# Patient Record
Sex: Female | Born: 1950 | Race: Black or African American | Hispanic: No | Marital: Married | Smoking: Former smoker
Health system: Southern US, Community
[De-identification: ages and names within clinical notes are randomized; demographics above are authoritative.]

## PROBLEM LIST (undated history)

## (undated) DIAGNOSIS — I10 Essential (primary) hypertension: Secondary | ICD-10-CM

## (undated) DIAGNOSIS — E669 Obesity, unspecified: Secondary | ICD-10-CM

## (undated) DIAGNOSIS — H409 Unspecified glaucoma: Secondary | ICD-10-CM

## (undated) DIAGNOSIS — E785 Hyperlipidemia, unspecified: Secondary | ICD-10-CM

## (undated) HISTORY — DX: Hyperlipidemia, unspecified: E78.5

## (undated) HISTORY — PX: TOTAL KNEE ARTHROPLASTY: SHX125

## (undated) HISTORY — DX: Essential (primary) hypertension: I10

## (undated) HISTORY — DX: Obesity, unspecified: E66.9

## (undated) HISTORY — DX: Unspecified glaucoma: H40.9

---

## 1999-03-14 HISTORY — PX: THYROIDECTOMY, PARTIAL: SHX18

## 1999-03-14 HISTORY — PX: TRIGGER FINGER RELEASE: SHX641

## 1999-08-08 ENCOUNTER — Ambulatory Visit (HOSPITAL_COMMUNITY): Admission: RE | Admit: 1999-08-08 | Discharge: 1999-08-08 | Payer: Self-pay | Admitting: *Deleted

## 1999-10-19 ENCOUNTER — Encounter: Payer: Self-pay | Admitting: Surgery

## 1999-10-21 ENCOUNTER — Ambulatory Visit (HOSPITAL_COMMUNITY): Admission: RE | Admit: 1999-10-21 | Discharge: 1999-10-22 | Payer: Self-pay | Admitting: Surgery

## 1999-10-31 ENCOUNTER — Encounter: Payer: Self-pay | Admitting: Obstetrics and Gynecology

## 1999-10-31 ENCOUNTER — Encounter: Admission: RE | Admit: 1999-10-31 | Discharge: 1999-10-31 | Payer: Self-pay | Admitting: Obstetrics and Gynecology

## 2000-10-08 ENCOUNTER — Encounter (INDEPENDENT_AMBULATORY_CARE_PROVIDER_SITE_OTHER): Payer: Self-pay | Admitting: Specialist

## 2000-10-08 ENCOUNTER — Other Ambulatory Visit: Admission: RE | Admit: 2000-10-08 | Discharge: 2000-10-08 | Payer: Self-pay | Admitting: Gynecology

## 2000-11-20 ENCOUNTER — Other Ambulatory Visit: Admission: RE | Admit: 2000-11-20 | Discharge: 2000-11-20 | Payer: Self-pay | Admitting: Gynecology

## 2000-12-25 ENCOUNTER — Inpatient Hospital Stay (HOSPITAL_COMMUNITY): Admission: RE | Admit: 2000-12-25 | Discharge: 2000-12-28 | Payer: Self-pay | Admitting: Gynecology

## 2000-12-25 ENCOUNTER — Encounter (INDEPENDENT_AMBULATORY_CARE_PROVIDER_SITE_OTHER): Payer: Self-pay | Admitting: Specialist

## 2001-01-17 ENCOUNTER — Encounter: Payer: Self-pay | Admitting: Family Medicine

## 2001-01-17 ENCOUNTER — Encounter: Admission: RE | Admit: 2001-01-17 | Discharge: 2001-01-17 | Payer: Self-pay | Admitting: Family Medicine

## 2001-08-27 ENCOUNTER — Emergency Department (HOSPITAL_COMMUNITY): Admission: EM | Admit: 2001-08-27 | Discharge: 2001-08-27 | Payer: Self-pay | Admitting: Emergency Medicine

## 2002-03-13 HISTORY — PX: ROTATOR CUFF REPAIR: SHX139

## 2002-03-13 HISTORY — PX: ABDOMINAL HYSTERECTOMY: SHX81

## 2002-11-21 ENCOUNTER — Encounter: Admission: RE | Admit: 2002-11-21 | Discharge: 2002-11-21 | Payer: Self-pay | Admitting: Obstetrics and Gynecology

## 2002-11-21 ENCOUNTER — Encounter: Payer: Self-pay | Admitting: Obstetrics and Gynecology

## 2003-01-29 ENCOUNTER — Other Ambulatory Visit: Admission: RE | Admit: 2003-01-29 | Discharge: 2003-01-29 | Payer: Self-pay | Admitting: Gynecology

## 2003-12-22 ENCOUNTER — Encounter: Admission: RE | Admit: 2003-12-22 | Discharge: 2003-12-22 | Payer: Self-pay | Admitting: Family Medicine

## 2004-12-26 ENCOUNTER — Encounter: Admission: RE | Admit: 2004-12-26 | Discharge: 2004-12-26 | Payer: Self-pay | Admitting: Family Medicine

## 2004-12-27 ENCOUNTER — Other Ambulatory Visit: Admission: RE | Admit: 2004-12-27 | Discharge: 2004-12-27 | Payer: Self-pay | Admitting: Family Medicine

## 2005-01-26 ENCOUNTER — Ambulatory Visit (HOSPITAL_COMMUNITY): Admission: RE | Admit: 2005-01-26 | Discharge: 2005-01-26 | Payer: Self-pay | Admitting: Gastroenterology

## 2005-01-26 ENCOUNTER — Encounter (INDEPENDENT_AMBULATORY_CARE_PROVIDER_SITE_OTHER): Payer: Self-pay | Admitting: *Deleted

## 2005-06-14 ENCOUNTER — Encounter (INDEPENDENT_AMBULATORY_CARE_PROVIDER_SITE_OTHER): Payer: Self-pay | Admitting: Specialist

## 2005-06-14 ENCOUNTER — Observation Stay (HOSPITAL_COMMUNITY): Admission: EM | Admit: 2005-06-14 | Discharge: 2005-06-15 | Payer: Self-pay | Admitting: Emergency Medicine

## 2006-01-25 ENCOUNTER — Encounter: Admission: RE | Admit: 2006-01-25 | Discharge: 2006-01-25 | Payer: Self-pay | Admitting: Family Medicine

## 2006-02-07 ENCOUNTER — Encounter: Admission: RE | Admit: 2006-02-07 | Discharge: 2006-02-07 | Payer: Self-pay | Admitting: Obstetrics and Gynecology

## 2006-08-10 ENCOUNTER — Encounter: Admission: RE | Admit: 2006-08-10 | Discharge: 2006-08-10 | Payer: Self-pay | Admitting: Family Medicine

## 2006-08-21 ENCOUNTER — Ambulatory Visit (HOSPITAL_BASED_OUTPATIENT_CLINIC_OR_DEPARTMENT_OTHER): Admission: RE | Admit: 2006-08-21 | Discharge: 2006-08-21 | Payer: Self-pay | Admitting: Orthopaedic Surgery

## 2007-01-31 ENCOUNTER — Encounter: Admission: RE | Admit: 2007-01-31 | Discharge: 2007-01-31 | Payer: Self-pay | Admitting: Family Medicine

## 2007-02-11 ENCOUNTER — Inpatient Hospital Stay (HOSPITAL_BASED_OUTPATIENT_CLINIC_OR_DEPARTMENT_OTHER): Admission: RE | Admit: 2007-02-11 | Discharge: 2007-02-11 | Payer: Self-pay | Admitting: Cardiology

## 2007-03-14 HISTORY — PX: APPENDECTOMY: SHX54

## 2007-09-24 ENCOUNTER — Inpatient Hospital Stay (HOSPITAL_COMMUNITY): Admission: RE | Admit: 2007-09-24 | Discharge: 2007-09-26 | Payer: Self-pay | Admitting: Orthopaedic Surgery

## 2008-02-04 ENCOUNTER — Encounter: Admission: RE | Admit: 2008-02-04 | Discharge: 2008-02-04 | Payer: Self-pay | Admitting: Family Medicine

## 2008-09-10 ENCOUNTER — Inpatient Hospital Stay (HOSPITAL_COMMUNITY): Admission: RE | Admit: 2008-09-10 | Discharge: 2008-09-13 | Payer: Self-pay | Admitting: Orthopaedic Surgery

## 2009-02-05 ENCOUNTER — Encounter: Admission: RE | Admit: 2009-02-05 | Discharge: 2009-02-05 | Payer: Self-pay | Admitting: Family Medicine

## 2010-03-16 ENCOUNTER — Encounter
Admission: RE | Admit: 2010-03-16 | Discharge: 2010-03-16 | Payer: Self-pay | Source: Home / Self Care | Attending: Family Medicine | Admitting: Family Medicine

## 2010-04-07 ENCOUNTER — Other Ambulatory Visit: Payer: Self-pay | Admitting: Gastroenterology

## 2010-05-10 ENCOUNTER — Other Ambulatory Visit (HOSPITAL_COMMUNITY)
Admission: RE | Admit: 2010-05-10 | Discharge: 2010-05-10 | Disposition: A | Discharge status: Home / Self Care | Payer: BC Managed Care – HMO | Source: Ambulatory Visit | Attending: Family Medicine | Admitting: Family Medicine

## 2010-05-10 ENCOUNTER — Other Ambulatory Visit: Payer: Self-pay | Admitting: Family Medicine

## 2010-05-10 DIAGNOSIS — Z124 Encounter for screening for malignant neoplasm of cervix: Secondary | ICD-10-CM | POA: Insufficient documentation

## 2010-06-19 LAB — PROTIME-INR
INR: 1 (ref 0.00–1.49)
INR: 1.8 — ABNORMAL HIGH (ref 0.00–1.49)
Prothrombin Time: 13.7 seconds (ref 11.6–15.2)
Prothrombin Time: 16.5 seconds — ABNORMAL HIGH (ref 11.6–15.2)
Prothrombin Time: 22.1 seconds — ABNORMAL HIGH (ref 11.6–15.2)

## 2010-06-19 LAB — BASIC METABOLIC PANEL
BUN: 17 mg/dL (ref 6–23)
BUN: 27 mg/dL — ABNORMAL HIGH (ref 6–23)
CO2: 26 mEq/L (ref 19–32)
CO2: 27 mEq/L (ref 19–32)
Calcium: 9.5 mg/dL (ref 8.4–10.5)
Calcium: 9.6 mg/dL (ref 8.4–10.5)
Chloride: 101 mEq/L (ref 96–112)
Creatinine, Ser: 1.07 mg/dL (ref 0.4–1.2)
Creatinine, Ser: 1.31 mg/dL — ABNORMAL HIGH (ref 0.4–1.2)
Creatinine, Ser: 1.48 mg/dL — ABNORMAL HIGH (ref 0.4–1.2)
GFR calc Af Amer: >60 mL/min (ref >60)
GFR calc non Af Amer: 36 mL/min — ABNORMAL LOW (ref >60)
GFR calc non Af Amer: 53 mL/min — ABNORMAL LOW (ref >60)
Glucose, Bld: 149 mg/dL — ABNORMAL HIGH (ref 70–99)
Glucose, Bld: 154 mg/dL — ABNORMAL HIGH (ref 70–99)
Glucose, Bld: 155 mg/dL — ABNORMAL HIGH (ref 70–99)
Potassium: 4 mEq/L (ref 3.5–5.1)
Sodium: 139 mEq/L (ref 135–145)

## 2010-06-19 LAB — CBC
HCT: 31.6 % — ABNORMAL LOW (ref 36.0–46.0)
Hemoglobin: 10.9 g/dL — ABNORMAL LOW (ref 12.0–15.0)
MCHC: 33.8 g/dL (ref 30.0–36.0)
MCHC: 34.3 g/dL (ref 30.0–36.0)
MCV: 90.3 fL (ref 78.0–100.0)
MCV: 90.8 fL (ref 78.0–100.0)
Platelets: 178 10*3/uL (ref 150–400)
Platelets: 191 10*3/uL (ref 150–400)
RDW: 12.6 % (ref 11.5–15.5)
RDW: 12.7 % (ref 11.5–15.5)
RDW: 13.1 % (ref 11.5–15.5)
WBC: 11.2 10*3/uL — ABNORMAL HIGH (ref 4.0–10.5)

## 2010-06-20 LAB — CBC
MCV: 90.5 fL (ref 78.0–100.0)
Platelets: 243 10*3/uL (ref 150–400)
RBC: 4.3 MIL/uL (ref 3.87–5.11)
WBC: 7.5 10*3/uL (ref 4.0–10.5)

## 2010-06-20 LAB — BASIC METABOLIC PANEL
BUN: 17 mg/dL (ref 6–23)
Calcium: 10.6 mg/dL — ABNORMAL HIGH (ref 8.4–10.5)
Creatinine, Ser: 0.9 mg/dL (ref 0.4–1.2)
GFR calc Af Amer: >60 mL/min (ref >60)
GFR calc non Af Amer: >60 mL/min (ref >60)

## 2010-07-26 NOTE — Op Note (Signed)
[name], [name] NO.:  [id number]   MEDICAL RECORD NO.:  [id number]          PATIENT TYPE:  INP   LOCATION:  5041                         FACILITY:  [facility]   PHYSICIAN:  Lubertha Basque. Dalldorf, M.D.DATE OF BIRTH:  1951-01-22   DATE OF PROCEDURE:  09/24/2007  DATE OF DISCHARGE:                               OPERATIVE REPORT   PREOPERATIVE DIAGNOSIS:  Left knee degenerative joint disease.   POSTOPERATIVE DIAGNOSES:  Left knee degenerative joint disease.   PROCEDURE:  Left total knee replacement.   ANESTHESIA:  General and block.   ATTENDING SURGEON:  Lubertha Basque. Jerl Santos, MD.   ASSISTANT:  Lindwood Qua, PA   INDICATIONS FOR PROCEDURE:  The patient is a 60 year old woman with a  long history of knee difficulty.  She has tried various oral anti-  inflammatories and injectables but none seem to work at this point.  She  has had a knee arthroscopy in years past.  She has advanced degenerative  change and pain which limits her ability to walk at rest.  She is  offered a knee replacement on the most symptomatic side which is the  left.  Informed operative consent was obtained after discussion of  possible complications of reaction to anesthesia, infection, DVT, PE,  and death.  The patient also understood about the importance of the  postoperative rehabilitation protocol to optimize result.   SUMMARY OF FINDINGS AND PROCEDURE:  Under general anesthesia and a  block, a left knee replacement was performed through a standard anterior  approach.  She had advanced degenerative change and good bone quality.  We addressed her problem with a cemented DePuy system using a standard  femur, 2.5 MBT tray, 35-mm all-polyethylene patella, and 10-mm deep dish  spacer.  Bryna Colander assisted throughout and was invaluable to the  completion of the case in that he helped position and retract while I  performed the procedure.  He also closed simultaneously to help minimize  the OR  time.   DESCRIPTION OF PROCEDURE:  The patient was brought to the operating  suite where general anesthetic was applied without difficulty.  She was  also given a block in the preanesthesia area.  She was positioned supine  and prepped and draped in normal sterile fashion.  After the  administration of IV Kefzol, the left leg was elevated, exsanguinated,  and tourniquet inflated about the thigh.  A longitudinal anterior  incision was made with dissection down the extensor mechanism.  All  appropriate antiinfective measures were used including the preoperative  IV antibiotic, Betadine impregnated drape, and closed-hooded exhaust  systems for each member of the surgical team.  A medial parapatellar  incision was made.  The kneecap was flipped and the knee flexed.  We did  have to create a adipose pouch to accept the kneecap, as she had about 2-  inch of adipose tissue between the skin and extensor mechanism.  This  extra tissue did make the case fairly difficult and added some  significant time.  The knee was exposed appropriately.  An  intramedullary guide was placed on the tibia  and utilized to create a  cut on the proximal surface of this bone.  An intramedullary rod was  then placed into the femur to make anterior and posterior cuts creating  a flexion gap of 10 mm.  A second intramedullary guide was placed in the  femur to make a distal cut creating an equal extension gap of 10 mm.  The femur sized to a standard and the tibia to a 2.5 with the  appropriate guides placed and utilized.  We elected to place the MBT  revision component on the tibia to address her stature.  The appropriate  guide was placed and utilized for the short stem on this component.  Trial reduction was done with these components along with the 10-mm  spacer.  She easily came to full extension and flexed well.  The patella  was cut down to a thickness by 9-mm to a size diameter of 15.  This  sized to a 35 with  appropriate guide placed utilized.  Trial components  removed followed by pulsatile lavage irrigation of all three cut bony  surfaces.  Cement was mixed including Zinacef antibiotic and was  pressurized onto the bones.  The aforementioned DePuy components were  placed.  Excess cement was trimmed and the pressure was held on the  components until the cement had hardened.  The tourniquet was deflated  and a small amount of bleeding was easily controlled with Bovie cautery.  The knee was thoroughly irrigated followed by placement of drain exiting  superolaterally.  The extensor mechanism was reapproximated with #1  Vicryl interrupted fashion followed by subcutaneous reapproximation with  0 and 2-0 undyed Vicryl.  Staples were used to reapproximate the skin  followed by Adaptic and a dry gauze dressing and loose Ace wrap.  Estimated blood loss and fluids can be obtained from anesthesia records  as can accurate tourniquet time.   DISPOSITION:  The patient was extubated in the operating room and taken  to recovery room in stable addition.  She will be admitted to the  Orthopedic Surgery Service for appropriate postop care to include  perioperative antibiotics and Coumadin plus Lovenox for DVT prophylaxis.      Lubertha Basque Jerl Santos, M.D.  Electronically Signed     PGD/MEDQ  D:  09/24/2007  T:  09/24/2007  Job:  [id number]

## 2010-07-26 NOTE — Discharge Summary (Signed)
[name], [name] NO.:  [id number]   MEDICAL RECORD NO.:  [id number]          PATIENT TYPE:  INP   LOCATION:  5039                         FACILITY:  [facility]   PHYSICIAN:  Lubertha Basque. Dalldorf, M.D.DATE OF BIRTH:  Jul 12, 1950   DATE OF ADMISSION:  09/10/2008  DATE OF DISCHARGE:  09/13/2008                               DISCHARGE SUMMARY   ADMITTING DIAGNOSES:  1. Right knee end-stage degenerative joint disease.  2. Left knee total knee replacement.  3. Hypertension.  4. Obesity.   DISCHARGE DIAGNOSES:  1. Right knee end-stage degenerative joint disease.  2. Left knee total knee replacement.  3. Hypertension.  4. Obesity.   OPERATION:  Right total knee replacement.   BRIEF HISTORY:  Ms. Umana is a 60 year old black female patient, well  known to our practice.  She has had a left knee replacement done, while  the right knee continues to bother her, pain with every step, trouble  sleeping at night time.  X-rays reveal end-stage DJD and we have  discussed treatment options with her that being total knee replacement.   PERTINENT LABORATORY DATA AND X-RAY FINDINGS:  Hemoglobin 10.8,  hematocrit 31.6, WBCs 11.2.  Sodium 139, potassium 4.0, BUN 27,  creatinine 1.48, glucose 149.  Last INR at 1.7.   COURSE IN THE HOSPITAL:  She was admitted postoperatively, placed on a  variety of p.o. and IV pain medications.  A Dilaudid, morphine pump was  used.  IV Ancef a gram q.6 x3 doses.  Lovenox and Coumadin DVT  prophylaxis protocol per Pharmacy, and then antiemetics, iron building  agents, antispasmodics were also incorporated with laxatives and stool  softeners.  She had knee-high TEDs on, incentive spirometer, physical  therapy for weightbearing as tolerated out of bed, and then a CPM  machine as well.  The first day postop, her blood pressure was 130/70,  temperature 98, heart rate 76.  Her Foley catheter was in but later  discontinued.  She was eating and voiding  well after the catheter  removal, and a PCA pump was also discontinued.  Her dressing was dry.  Wound was noted to be benign.  On day #2 postop, blood pressure 106/67,  heart rate 102, temperature 98.  Dressing was changed.  Drain was  removed.  Wound was noted to be benign without sign of infection.  She  progressed well with physical therapy and was discharged home.   CONDITION ON DISCHARGE:  Improved.   FOLLOWUP:  She will remain on a low-sodium, heart-healthy diet.  May  change her dressing daily.  Weightbearing as tolerated.  Home physical  therapy and blood draws per [facility].  Any sign of infection and also  to make an appointment in 2 weeks, to call our office at [phone].  She  is given a prescription for Coumadin and she will be on for 2 weeks,  dose regulated by Pharmacy; Percocet 1 or 2 every 4-6 hours for pain.  I  then kept on her home medicines which are:  1. Claritin 10 mg 1 a day.  2. Ferrous sulfate 325 one  a day.  3. Hydrochlorothiazide 12.5 mg 1 a day.  4. Lotensin 20 mg one a day.  5. Multivitamin with minerals 1 a day.  6. Norvasc 10 mg 1 a day.  7. Zocor 80 mg 1 a day.  8. Robaxin 500 one q.8 p.r.n. spasm.      Lindwood Qua, P.A.      Lubertha Basque Jerl Santos, M.D.  Electronically Signed    MC/MEDQ  D:  09/13/2008  T:  09/14/2008  Job:  [id number]

## 2010-07-26 NOTE — Op Note (Signed)
[name], DOROUGH NO.:  [id number]   MEDICAL RECORD NO.:  [id number]          PATIENT TYPE:  INP   LOCATION:  5039                         FACILITY:  [facility]   PHYSICIAN:  Lubertha Basque. Dalldorf, M.D.DATE OF BIRTH:  15-Jan-1951   DATE OF PROCEDURE:  09/10/2008  DATE OF DISCHARGE:                               OPERATIVE REPORT   PREOPERATIVE DIAGNOSIS:  Right knee degenerative arthritis.   POSTOPERATIVE DIAGNOSIS:  Right knee degenerative arthritis.   PROCEDURE:  Right total knee replacement.   ANESTHESIA:  General and block.   ATTENDING SURGEON:  Lubertha Basque. Jerl Santos, MD   ASSISTANT:  Lindwood Qua, PA   INDICATIONS FOR PROCEDURE:  The patient is a 60 year old woman with long  history of bilateral knee pain.  She persists with difficulty despite  oral anti-inflammatories and injectables even an arthroscopy on this  side.  She is status post a successful knee replacement on the opposite  side about a year back and wishes to have the same procedure done on the  right.  Informed operative consent was obtained after discussion of  possible complications including reaction to anesthesia, infection, DVT,  PE, and death.   SUMMARY OF FINDINGS AND PROCEDURE:  Under general anesthesia and a block  through a standard anterior longitudinal incision, a right total knee  replacement was performed.  She had advanced degenerative change,  especially medial, but excellent bone quality.  We addressed her problem  with a cemented DePuy LPS system using the standard femur, 10 deep dish  spacer, 2.5 MBT revision tibial tray, and 35-mm all-polyethylene  patella, components which I believe were identical to those used in the  opposite side.  I did use antibiotic Zinacef in the cement.  Bryna Colander assisted throughout and was invaluable to the completion of the  case, in that, he helped to retract while I performed the procedure.  He  also closed simultaneously to help minimize  OR time.  This case was  fairly difficult due to the size of the patient's leg, which added  significantly to difficulty of exposure and operative and tourniquet  times.   DESCRIPTION OF PROCEDURE:  The patient was taken to the operating suite  where general anesthetic was applied without difficulty.  She was  positioned supine and prepped and draped in normal sterile fashion.  She  was also given a block in the preanesthesia area.  After the  administration of preop IV antibiotic, an anterior longitudinal incision  was made.  Dissection was carried down to the extensor mechanism.  All  appropriate anti-infected measures were used including closed-hooded  exhaust systems for each member of the surgical team, Betadine  impregnated drape, and preoperative IV Kefzol.  We dissected down  through an abundance of adipose tissue and found the retinaculum about  the knee.  A medial parapatellar incision was made.  We created a pocket  in the adipose tissue to accept the patella and this could not flip  completely due to her size.  We were able flip this and take it lateral  into an adipose pad and bent  her knee up.  Exposure again was quite  difficult due to the size of the leg.  Eventually, the tibia was exposed  fully.  Some residual meniscal tissues were removed along with the ACL  and PCL.  An intramedullary guide was placed in the tibia to make a cut  roughly flat across the tibia.  We then placed a second intramedullary  guide in the femur to make anterior and posterior cuts creating a  flexion gap of 10 mm.  A second intramedullary guide was placed in the  femur to make a distal cut creating an equal extension gap of 10 mm  balancing the knee.  The femur sized to a standard component and the  tibia to 2.5 with appropriate guides placed and utilized.  We did elect  to ream for the short MBT stem on the tibia to address her stature.  The  patella was cut down in thickness by 10 mm to 15 and  sized to a 35 with  the appropriate guide placed and utilized.  A trial reduction was done  with all these components in the 10 spacer.  She easily came to full  extension and flexed well and the patella tracked well.  Trial  components were removed followed by pulsatile lavage irrigation of three  cut bony surfaces.  The cement was mixed including Zinacef and this was  pressurized onto the bones followed by placement of the aforementioned  DePuy LPS components.  Excess cement was trimmed and pressure was held  on the components until cement had hardened.  The tourniquet was  deflated and a small amount of bleeding was easily controlled with some  pressure and Bovie cautery.  The knee was irrigated followed by  placement of a drain exiting superolaterally.  The extensor mechanism  was reapproximated with #1 Vicryl interrupted fashion followed by  subcutaneous reapproximation with 0 and 2-0 undyed Vicryl and skin  closure with staples.  Adaptic was applied followed by dry gauze and a  loose Ace wrap.  Estimated blood loss and intraoperative fluid obtained  from anesthesia records as can accurate tourniquet time, which was about  an hour.   DISPOSITION:  The patient was extubated in the operating room and taken  to recovery room in stable condition.  She was to be admitted to the  Orthopedic Surgery Service for appropriate postop care to include  perioperative antibiotics and Coumadin plus Lovenox for DVT prophylaxis.      Lubertha Basque Jerl Santos, M.D.  Electronically Signed     PGD/MEDQ  D:  09/10/2008  T:  09/11/2008  Job:  [id number]

## 2010-07-26 NOTE — Cardiovascular Report (Signed)
[name], MARTON NO.:  [id number]   MEDICAL RECORD NO.:  [id number]          PATIENT TYPE:  OIB   LOCATION:  1965                         FACILITY:  [facility]   PHYSICIAN:  Peter M. Sw[place]iland, M.D.  DATE OF BIRTH:  07/21/1950   DATE OF PROCEDURE:  02/11/2007  DATE OF DISCHARGE:                            CARDIAC CATHETERIZATION   INDICATIONS FOR PROCEDURE:  A 60 year old white female presents with  symptoms of dyspnea on exertion with history of hypertension and  hypercholesterolemia.  She had an abnormal Cardiolite study suggesting  anterior wall ischemia.   PROCEDURE:  Left heart catheterization, coronary left ventricular  angiography.   EQUIPMENT USED:  4-French 4-cm left Judkins catheter, 4-French 3DRC  catheter, 4-French pigtail catheter, 4-French arterial sheath.   MEDICATIONS:  Local anesthesia 1% Xylocaine, Versed 2 mg IV.   CONTRAST:  90 mL of Omnipaque.   HEMODYNAMIC DATA:  Aortic pressure 133/75 with a mean of 102 mmHg.  Left  ventricle pressure was 132 with EDP of 15 mmHg.   ANGIOGRAPHIC DATA:  The left coronary arises and distributes normally.  The left main coronary has an upward takeoff and appears normal.   The left anterior descending artery is normal throughout its course.  It  gives rise to a modest sized diagonal branch which is also normal.   The left circumflex coronary artery is very tortuous and appears normal  throughout its course.   The right coronary artery arises normally and is a dominant vessel.  It  has mild irregularities in the proximal and midvessel up to 20%.   Left ventricular angiography was performed in RAO view.  This  demonstrates normal left ventricular size and contractility with normal  systolic function.  Ejection fraction is estimated at 65%.  There is no  significant mitral insufficiency noted.   FINAL INTERPRETATION:  1. 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  2. Normal left  ventricular function.   PLAN:  Based on these findings, would recommend continued risk factor  modification.           ______________________________  Peter M. [place], M.D.     PMJ/MEDQ  D:  02/11/2007  T:  02/11/2007  Job:  [id number]   cc:   Vikki Ports, M.D.

## 2010-07-26 NOTE — Op Note (Signed)
[name], SOMERO NO.:  [id number]   MEDICAL RECORD NO.:  [id number]          PATIENT TYPE:  AMB   LOCATION:  DSC                          FACILITY:  [facility]   PHYSICIAN:  Lubertha Basque. Dalldorf, M.D.DATE OF BIRTH:  08/12/1950   DATE OF PROCEDURE:  08/21/2006  DATE OF DISCHARGE:                               OPERATIVE REPORT   PREOPERATIVE DIAGNOSIS:  1. Right knee degenerative joint disease and torn medial meniscus.  2. Left knee degenerative joint disease and torn medial meniscus.   POSTOPERATIVE DIAGNOSIS:  1. Right knee degenerative joint disease and torn medial meniscus.  2. Left knee degenerative joint disease and torn medial meniscus.   PROCEDURE:  1. Right knee partial medial meniscectomy.  2. Right knee abrasion chondroplasty.  3. Left knee partial medial meniscectomy.   ANESTHESIA:  General.   ATTENDING SURGEON:  Lubertha Basque. Jerl Santos, M.D.   ASSISTANT:  Lindwood Qua, P.A.-C.   INDICATIONS FOR PROCEDURE:  The patient is a 60 year old woman with a  long history of bilateral knee pain.  This has persisted despite  injectables including cortisone and Synvisc as well as various oral anti-  inflammatories.  She has pain which limits her ability to rest and walk.  By x-ray, she has had some moderate joint space narrowing.  She is  offered arthroscopies for diagnostic and hopefully therapeutic purposes.  Informed operative consent was obtained after a discussion of the  possible complications of reaction to anesthesia and infection.   SUMMARY OF FINDINGS AND PROCEDURE:  Under general anesthesia, [name]  underwent bilateral knee arthroscopies.  We first operated on the left.  The suprapatellar pouch was benign as was the patellofemoral joint.  She  had a degenerative tear of the medial meniscus addressed with about a 5%  partial medial meniscectomy back to stable tissues.  She did have  advanced change in this compartment across broad areas of both the  femur  and tibia.  A chondroplasty was done.  The lateral compartment was  benign with no evidence of meniscal or articular cartilage injury.  The  ACL was intact.  We then turned our attention to the right knee.  Findings were similar.  The suprapatellar pouch was benign as was the  patellofemoral joint.  The medial compartment exhibited a similar  degenerative tear of the medial meniscus addressed with about 5% partial  medial meniscectomy.  She had advanced degenerative change in this  compartment similar to the opposite knee.  In the lateral compartment,  she had normal meniscal structure but she did have some grade 4 change  in a focal area addressed with an abrasion to bleeding bone.   DESCRIPTION OF PROCEDURE:  The patient was taken to the operating suite  where general anesthetic was applied without difficulty.  She was  positioned supine and prepped and draped in a normal sterile fashion.  After the administration of preop IV Kefzol, arthroscopies of both knees  were performed.  We first operated on the left with findings as noted  above.  A partial meniscectomy was done with a shaver followed by some  thorough  chondroplasties in the other compartments.  We injected with  the usual agents plus Depo-Medrol on this side.  Attention was then  turned toward the right knee.  Findings were relatively identical in the  medial and patellofemoral compartments and a medial meniscus was done  with a shaver back to stable tissues.  In the lateral compartment, she  did have a small area of grade 4 change which was focal and was  addressed with abrasion to bleeding bone.  The meniscus, itself, was  intact here.  This knee also was injected with Marcaine with epinephrine  and morphine plus Depo-Medrol.  Adaptic was placed over the portals on  both knees followed by dry gauze and loose Ace wraps on both sides.  Estimated loss and intraoperative fluids can be obtained from anesthesia   records.   DISPOSITION:  The patient was extubated in the operating room and taken  to the recovery room in stable addition.  She was to go home same-day  and follow up in the office next week.  I will contact her by phone  tonight.      Lubertha Basque Jerl Santos, M.D.  Electronically Signed     PGD/MEDQ  D:  08/21/2006  T:  08/21/2006  Job:  [id number]

## 2010-07-26 NOTE — H&P (Signed)
NAMEMarland [name]  QUISHA, MABIE NO.:  [id number]   MEDICAL RECORD NO.:  [id number]           PATIENT TYPE:   LOCATION:                                 FACILITY:   PHYSICIAN:  Peter M. [place]il[place], M.D.  DATE OF BIRTH:  08-Mar-1951   DATE OF ADMISSION:  DATE OF DISCHARGE:                              HISTORY & PHYSICAL   HISTORY OF PRESENT ILLNESS:  Ms. [name] is a pleasant 60 year old black  female who is seen for new onset of dyspnea.  The patient does have  history of obesity, hypertension, and hypercholesterolemia.  She has  noted a significant change recently in shortness of breath when she  walks up stairs to her office over the past few months.  However, it has  become much worse over the past week.  She really denied any significant  chest pain. She has had no increased edema.  She does exercise on an  elliptical machine since she has severe arthritis in her knees which has  limited her activity. To further evaluation her symptoms, she underwent  an adenosine Cardiolite study on February 05, 2007.  This demonstrated  evidence of anterior wall ischemia with normal left ventricular function  and ejection fraction 65%.  Based on these findings, it is recommended  that she have diagnostic cardiac catheterization.   PAST MEDICAL HISTORY:  1. Hypertension.  2. Hypercholesterolemia.  3. 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   PAST SURGICAL HISTORY:  She has had prior surgeries including two C-  sections, thyroid surgery, rotator cuff surgery, arthroscopic surgery of  her knees, hysterectomy, appendectomy, and surgery for trigger fingers.   ALLERGIES:  No known allergies.   CURRENT MEDICATIONS:  1. Include Caduet 10/40 mg per day.  2. Glucosamine 1500 mg daily.  3. Triamterene hydrochlorothiazide 37.5/25 mg daily.  4. Vicodin 1 b.i.d.  5. Multivitamin daily.  6. Flaxseed supplement daily.  7. Mobic twice a day.   SOCIAL HISTORY:  She is the Production designer, theatre/television/film of a warehouse.  She  is married.  She has 2 healthy children.  She quit smoking 25 years ago, and rarely  drinks alcohol.   FAMILY HISTORY:  Father died in his 70s of alcoholism.  Mother died at  age 54 with congestive heart failure.  She had had a massive myocardial  infarction at age 65.  One brother age 26 is in good health, two sisters  are in good health.   REVIEW OF SYSTEMS:  She has severe limitation related to her  osteoarthritis of her knees.  She feels that she is going to eventually  need to have a knee replacement surgery.  She has had no history of TIA  or stroke, she has had no bleeding disorder.  No recent change in bowel  or bladder habits.  Other review of systems is negative.   PHYSICAL EXAMINATION:  GENERAL:  Pleasant black female in no distress.  VITAL SIGNS:  Weight 195.  Blood pressure is 130/82, pulse 72 and  regular.  HEENT:  Exam is unremarkable.  She has no JVD, adenopathy or thyromegaly  or bruits.  LUNGS:  Clear.  CARDIAC:  Exam was without gallop, murmur, rub, or click.  ABDOMEN:  Obese, soft, nontender, without masses or bruits.  There is no  hepatosplenomegaly.  EXTREMITIES:  Reveal 2+ pedal pulses and femoral pulses.  She has no  edema or phlebitis.  NEUROLOGIC:  Exam is nonfocal.   LABORATORY DATA:  Her ECG is normal.  Other labs are pending.   IMPRESSION:  1. New onset of dyspnea on exertion with abnormal adenosine Cardiolite      study indicating evidence of anterior wall ischemia.  2. Hypertension.  3. Hypercholesterolemia.  4. Obesity.   PLAN:  Proceed with diagnostic cardiac catheterization and further  therapy pending these results.           ______________________________  Peter M. [place], M.D.     PMJ/MEDQ  D:  02/08/2007  T:  02/08/2007  Job:  [id number]   cc:   Vikki Ports, M.D.

## 2010-07-29 NOTE — H&P (Signed)
[name], [name] NO.:  [id number]   MEDICAL RECORD NO.:  [id number]          PATIENT TYPE:  INP   LOCATION:  6295                         FACILITY:  [facility]   PHYSICIAN:  Lebron Conners, M.D.   DATE OF BIRTH:  02-19-1951   DATE OF ADMISSION:  06/14/2005  DATE OF DISCHARGE:                                HISTORY & PHYSICAL   CHIEF COMPLAINT:  Abdominal pain.   PRESENT ILLNESS:  The patient is a 60 year old black female with a 1-day  history of central then right lower quadrant abdominal pain.  She was seen  at her doctor's office and sent to the emergency department where a white  count was found to be elevated at about 12,200 with a left shift, and a CT  scan was done showing findings of acute appendicitis.  I am asked to see  this patient for evaluation and treatment of the problem.  She has no  chronic GI problems.   PAST MEDICAL HISTORY:  1.  High blood pressure.  2.  Hyperlipidemia.   PAST SURGICAL HISTORY:  1.  Hysterectomy.  2.  Cesarean section.  3.  Thyroidectomy.  4.  Rotator cuff repair.   SOCIAL HISTORY:  She is a nonsmoker and occasionally drinks alcoholic  beverages.   FAMILY HISTORY:  Unremarkable.   CHILDHOOD ILLNESSES:  Unremarkable.   ALLERGIES:  No medicine allergies.   MEDICATIONS:  1.  Caduet.  2.  Over-the-counter pain medicines.  3.  Darvocet occasionally.  4.  Hydrochlorothiazide.  5.  Triamterene.   REVIEW OF SYSTEMS:  The review of systems in detail is unremarkable.   PHYSICAL EXAMINATION:  VITAL SIGNS:  Temperature was 99.1, and the vital  signs were normal as recorded by nursing staff.  GENERAL:  The patient is overweight.  Mental status was normal, and she was  in no acute distress.  HEENT:  The head and neck exam was unremarkable with no enlargement of the  thyroid.  No neck mass.  No thyroid mass.  CHEST:  Clear to auscultation, and there was no chest wall tenderness.  HEART:  Rate and rhythm were normal, and  there was no murmur or gallop  detected.  The peripheral pulses were full.  ABDOMEN:  Marked right lower quadrant tenderness, but no rebound tenderness  present.  Bowel sounds present.  No other mass or tenderness was noted.  EXTREMITIES:  No edema, no skin lesions.  SKIN:  No lesions were noted.  LYMPH NODES:  The lymph nodes were not enlarged in the groin.   IMPRESSION:  1.  Acute appendicitis.  2.  Hypertension.  3.  Hyperlipidemia.   PLAN:  Laparoscopic appendectomy immediately.  The patient gives her  consent.      Lebron Conners, M.D.  Electronically Signed     WB/MEDQ  D:  06/14/2005  T:  06/14/2005  Job:  [id number]

## 2010-07-29 NOTE — Op Note (Signed)
[facility]  Patient:    [name], [name] Visit Number: [id number] MRN: [id number]          Service Type: GYN Location: 9300 [id number] Attending Physician:  Douglass Rivers Dictated by:   Douglass Rivers, M.D. Proc. Date: 12/25/00 Admit Date:  12/25/2000                             Operative Report  PREOPERATIVE DIAGNOSES:       1. Symptomatic fibroid uterus.                               2. Menorrhagia.  POSTOPERATIVE DIAGNOSES:      1. Symptomatic fibroid uterus.                               2. Menorrhagia.  OPERATION:                    Total abdominal hysterectomy, bilateral                               salpingo-oophorectomy and a myomectomy.  SURGEON:                      Douglass Rivers, M.D.  ASSISTANT:                    Katy Fitch, M.D.  ANESTHESIA:                   General anesthesia.  IV FLUIDS:                    2300 cc of lactated Ringers.  URINE OUTPUT:                 425 cc of clear urine.  ESTIMATED BLOOD LOSS:         300 cc.  FINDINGS:                     Multifibroid uterus, normal ovaries, tubes had                               evidence of previous bilateral tubal ligation.  COMPLICATIONS:                None.  PATHOLOGY:                    Uterus, tubes, ovaries and cervix as well as                               myoma.  DESCRIPTION OF PROCEDURE:     The patient was taken to the operating room, placed in the supine position with careful attention to the arm that she had broken.  General anesthesia was induced and prepped and draped in usual sterile fashion.  A Pfannenstiel skin incision was made with a scalpel going through the previous C-section scar.  The incision was carried through to the underlying layer of fascia which was scored in the midline and the fascial incision was extended laterally with the Mayo scissors.  The  inferior aspect of the fascial incision was grasped with Kochers.  Underlying  rectus muscles were dissected off by blunt and sharp dissection.  The superior aspect of the incision was markedly scarred to the underlying rectus muscles and needed to be sharply dissected down with scalpel until both sides were adequately freed. The rectus muscles were noted to be separated in the midline.  They were elevated with Allis clamps and then the peritoneum was entered bluntly with a Tresa Endo.  The peritoneal incision was then extended superiorly and inferiorly with good visualization of underlying bowel and bladder.  The orientation of the uterus was confirmed.  The OConnor-OSullivan retractor was then placed in the incision and bowels were packed away with moist lap pack x 4.  The bladder blade was then inserted. The lateral blades were noted to be clear of bowel as well as psoas muscle.  The tubo-ovarian ligament as well as round were grasped between two Kelly clamps. The round ligaments were then transected and suture ligated bilaterally.  The anterior leaf of the broad ligament was incised; however, only partially secondary to limitations of the fibroids.  The posterior leaf of the broad ligament was then also incised and the infundibulopelvic ligament was clamped, transected, free tie and suture ligature of 0 Vicryl were both placed. The uterine vessels on the right were able to be skeletonized and the bladder was sharply taken down on the right side.  The attention was then turned toward the left. There was a large fibroid that was distorting the left adnexa; however, the IP was able to be identified.  The posterior leaf of the broad ligament was then incised and in a similar fashion, a free tie and a suture ligature was placed.  At this point, we elected to do a myomectomy off the angle as it was distorting the vessels and were unable to be seen. The serosa over the myoma was entered and the myoma was sharply and bluntly dissected out using electrocautery.  There was a  small amount of bleeding at the apex and this was treated with a suture on what turned out to be the specimen side. The uterine vessels were then able to be visualized on the second myoma that was still laterally placed on that side.  After the anterior leaf of the broad ligament was better incised on the left, the uterine vessels were transected and suture ligated and then sharply dissected off the fibroid.  Attention was then turned toward the right. The uterine vessels were transected, suture ligated x 2. The dissection of the cardinal ligament was then carried out in a similar fashion with careful attention to the bladder anteriorly.  Attention was then turned back toward the left.  The uterine vessels were reclamped more posteriorly and again a ligament was placed in the form of a free tie.  The cardinal ligaments and the remaining uterine vessels were then transected and suture ligated with 0 Vicryl, with careful attention toward the bladder anteriorly which was taken down both bluntly and sharply.  The endopelvic fascia was able to be identified. The dissected carried through on the right including the uterosacral ligament and then the vagina was entered again from the right with careful attention toward the bladder anteriorly. The dissection was carried through on the left to the level of the uterosacral and then the uterus was passed off the table.  ADDENDUM:  After the uterosacral ligaments were obtained on both sides, the corpus of the uterus was sharply  dissected off and the cervical stump was held with a tenaculum.  The cervix was then sharply dissected off.  The angles of the vagina were then transfixed to the ipsilateral cardinal ligaments and everted. This was done bilaterally.  The vagina was then closed with three figure-of-eight sutures.  The pelvis was then irrigated with copious amounts of warm saline.  The pedicles were inspected and noted to be hemostatic.  The sponges  were then removed as were the lap packs and the retractor. The muscle was inspected and small areas of bleeding were treated with electrocautery. The fascia was then closed with 0  PDS. The subcu was irrigated. The skin edges were noted to be bleeding, probably secondary to longstanding NSAID use.  They were treated with cautery. Staples were placed and the pressure dressing was then placed on the incision. The patient tolerated the procedure well.  Sponge, lap and needle counts correct x 2.  She was given Ancef intraoperatively and transferred to the PACU in stable condition. Dictated by:   Douglass Rivers, M.D. Attending Physician:  Douglass Rivers DD:  12/25/00 TD:  12/25/00 Job: [id number]

## 2010-07-29 NOTE — Op Note (Signed)
[place]  Patient:    [name], [name]                    MRN: [id number] Proc. Date: 10/21/99 Adm. Date:  [id number] Attending:  Bonnetta Barry CC:         Bing Neighbors. Tenny Craw, M.D.  Fritzi Mandes, M.D.   Operative Report  PREOPERATIVE DIAGNOSIS:  Right thyroid nodule, hyperthyroidism.  POSTOPERATIVE DIAGNOSIS:  Right thyroid nodule, hyperthyroidism.  OPERATION PERFORMED:  Right thyroid lobectomy.  SURGEON:  Velora Heckler, M.D.  ASSISTANT:  Donnie Coffin. Samuella Cota, M.D.  ANESTHESIA:  General.  ANESTHESIOLOGIST:  Dr. Randa Evens.  ESTIMATED BLOOD LOSS:  Minimal.  PREPARATION:  Betadine.  COMPLICATIONS:  None.  INDICATIONS FOR PROCEDURE:  Patient is a 60 year old black female who presented with toxic thyroid nodule.  The patient had been evaluated by Bing Neighbors. Tenny Craw, M.D. and Fritzi Mandes, M.D.  She was placed on Tapazole. Thyroid scan in May of 2001 demonstrated a toxic autonomous nodule in the right lobe of the gland.  The patient discussed radioactive iodine treatment versus surgical excision.  The patient comes to surgery today for right lobectomy.  DESCRIPTION OF PROCEDURE:  The procedure was done in OR #9 at the [facility].  The patient was brought to the operating room and placed in supine position on the operating room table.  Following administration of general anesthesia, the patient was prepped and draped in the usual strict aseptic fashion.  After ascertaining that an adequate level of anesthesia had been obtained, a standard Kocher incision was made with a #10 blade.  Dissection was carried down through the platysma.  Hemostasis was obtained with the electrocautery.  Skin flaps were developed cephalad and caudad from the thyroid notch to the sternal notch.  A Mahorner self-retaining retractor was placed for exposure.  Strap muscles were incised in the midline. Dissection was begun on the right  side. There was an obvious large fleshy nodule involving the right superior pole.  Strap muscles were reflected laterally.  Venous tributaries were divided between small and medium ligaclips.  The gland was rolled anteriorly.  Superior pole vessels were divided between medium ligaclips.  Gland was rolled further up and onto the anterior surface of the trachea.  Branches of the inferior thyroid artery were divided between small ligaclips.  Dissection was carried down to the ligament of Berry.  The ligament was transected and the gland was rolled further anteriorly.  The isthmus was mobilized off the anterior surface of the trachea.  The isthmus was transected between hemostats and suture ligated with 3-0 Vicryl suture ligatures.  Good hemostasis was noted. Right side of the neck was irrigated.  Surgicel was placed over the area of the recurrent laryngeal nerve and parathyroid gland as well as the area of the superior pole vessels.  The gland was submitted to pathology for frozen section analysis. Dr. Laureen Ochs reviewed this and reports that this is a follicular lesion with no sign of capsular invasion and no sign of malignancy.  Final diagnosis will pend permanent sectioning.  Good hemostasis was noted throughout the right side of the neck.  The left lobe was evaluated and on palpation was grossly normal.  Strap muscles were reapproximated in the midline with interrupted 3-0 Vicryl sutures.  Platysma was reapproximated with interrupted 3-0 Vicryl sutures.  Skin edges were reapproximated with widely spaced stainless steel staples followed by benzoin and half-inch Steri-Strips.  A  sterile gauze dressing was applied.  The patient was awakened from anesthesia and brought to the recovery room in stable condition.  The patient tolerated the procedure well. DD:  10/21/99 TD:  10/21/99 Job: [id number]

## 2010-07-29 NOTE — Op Note (Signed)
[name], [name] NO.:  [id number]   MEDICAL RECORD NO.:  [id number]          PATIENT TYPE:  AMB   LOCATION:  ENDO                         FACILITY:  [facility]   PHYSICIAN:  Graylin Shiver, M.D.   DATE OF BIRTH:  04-19-1950   DATE OF PROCEDURE:  01/26/2005  DATE OF DISCHARGE:                                 OPERATIVE REPORT   PROCEDURE:  Colonoscopy with biopsy.   INDICATIONS:  Family history of colon polyps.   Informed consent was obtained after explanation of the risks of bleeding,  infection, and perforation.   PREMEDICATION:  Fentanyl 75 mcg IV, Versed 8 mg IV.   PROCEDURE:  With the patient in the left lateral decubitus position, a  rectal exam was performed.  No masses were felt.  The Olympus colonoscope  was inserted into the rectum and advanced around the colon to the cecum.  Cecal landmarks were identified.  In the cecum, there was a small 3 mm polyp  biopsied off with cold forceps.  The ascending colon looked normal.  The  transverse colon showed a 3-mm polyp biopsied off with cold forceps.  The  descending colon was normal.  The sigmoid showed 2 small 3 mm polyps  biopsied off with cold forceps. The rectum looked normal.  Some hemorrhoids  were noted.  She tolerated the procedure well without complications.   IMPRESSION:  1.  Several small colon polyps, pathology pending.  2.  Hemorrhoids.   PLAN:  The pathology will be checked.           ______________________________  Graylin Shiver, M.D.     SFG/MEDQ  D:  01/26/2005  T:  01/26/2005  Job:  [id number]   cc:   C. Duane Lope, M.D.  Fax: [phone]

## 2010-07-29 NOTE — Op Note (Signed)
NAMEJASMYNN, PFALZGRAF NO.:  [id number]   MEDICAL RECORD NO.:  [id number]          PATIENT TYPE:  INP   LOCATION:  1604                         FACILITY:  [facility]   PHYSICIAN:  Lebron Conners, M.D.   DATE OF BIRTH:  31-Aug-1950   DATE OF PROCEDURE:  06/14/2005  DATE OF DISCHARGE:  06/15/2005                                 OPERATIVE REPORT   PRE AND POSTOPERATIVE DIAGNOSIS:  Acute appendicitis   OPERATION:  Laparoscopic appendectomy.   SURGEON:  Bowman.   ANESTHESIA:  General and local.   BLOOD LOSS:  Minimal.   COMPLICATIONS:  None.   CONDITION TO PACU:  Good.   SPECIMEN:  Appendix.   PROCEDURE:  After the patient was monitored and asleep and had routine  preparation and draping of the abdomen and had a Foley catheter in place, I  infiltrated local anesthetic in the lower midline and then in the right  upper quadrant.  I made a short vertical incision just below the umbilicus  and dissected down to the fascia and opened it for 2 cm.  I easily entered  the peritoneal cavity bluntly then placed a 0 Vicryl pursestring suture in  the fascia and secured a Hasson cannula and inflated the abdomen with carbon  dioxide.  I then placed a large lower midline port and a 5 mm right upper  quadrant port under direct view, noting that viscera were not injured with  passage of ports.  I then inspected the abdominal contents and saw no  problems except that there were a good deal of pelvic adhesions and the view  of the tip of the cecum and appendix was obscured.  Using a combination of  blunt dissection, scissors and cautery,  I took down adhesions being very  careful to avoid small bowel injury.  I then could partially expose the  appendix as it emerged from the cecum and I grasped it and elevated it and  dissected further and saw that it was acutely inflamed and then I was able  to fully pull it up.  I stapled across it near the base with a cutting  stapler and then  stapled across the mesentery as well with cutting stapler  and noted good hemostasis.  I irrigated the area and suctioned it dry.  I  again inspected for a small bowel injury and saw no evidence of that.  I  placed the appendix in a plastic pouch and removed it through the umbilical  incision and tied the pursestring suture.  After checking again for  good hemostasis, I removed the right upper quadrant port under direct view  and saw no bleeding from the abdominal wall.  I removed the lower midline  port after allowing carbon dioxide to escape.  I closed all skin incisions  with intracuticular 4-0 Vicryl and Steri-Strips.      Lebron Conners, M.D.  Electronically Signed     WB/MEDQ  D:  06/14/2005  T:  06/15/2005  Job:  [id number]

## 2010-07-29 NOTE — H&P (Signed)
[facility]  Patient:    [name], [name] Visit Number: [id number] MRN: [id number]          Service Type: Attending:  Douglass Rivers, M.D. Dictated by:   Douglass Rivers, M.D. Adm. Date:  12/25/00                           History and Physical  CHIEF COMPLAINT:              Symptomatic fibroid uterus.  HISTORY OF PRESENT ILLNESS:   The patient is a 60 year old, G5, P2-0-3-2, with a history of irregular menses, menorrhagia, pressure, and clots.  The patient states that her menses are very heavy and she has variable flow, changing pads every three hours, often with considerable overflow at night.  The patient also states that her menses are becoming increasingly irregular.  She was diagnosed with an abnormal thyroid, but is status post a partial thyroidectomy and her thyroid has been normal since then.  However, her menses still remain somewhat irregular.  She was diagnosed with fibroids last year.  She had an ultrasound done this year which showed her uterus to be enlarged at 12.2 x 6 x 7 with multiple fibroids, the largest of which was 4 cm.  The ovaries were unremarkable, except for the fact that the left ovary was displaced posteriorly by the fibroids.  PAST OB/GYN HISTORY:          Significant for normal Pap smears, negative mammograms.  Prior to this, normal menses.  An endometrial biopsy was done because of irregular menses, which was benign.  PAST MEDICAL HISTORY:         Significant for a hot nodule and status post thyroidectomy in August 2001.  She also has chronic hypertension and arthritis.  She has had two prior cesarean sections.  MEDICATIONS:                  1.  Cardura once a day.                               2.  Triamterene once a day.                               3.  Aleve once a day for her arthritis.  ALLERGIES:                    Negative.  FAMILY HISTORY:               Noncontributory.  SOCIAL HISTORY:                Social alcohol.  No tobacco.  Regular caffeine. Exercises via walking.  PHYSICAL EXAMINATION:  GENERAL:                      She is a well-appearing female in no acute distress.  HEENT:                        Unremarkable.  NECK:                         Thyroid is midline, nontender, and mobile.  HEART:  Regular rate.  LUNGS:                        Clear to auscultation.  BREASTS:                      Without mass, discharge, or retractions.  ABDOMEN:                      Soft and nontender.  GYNECOLOGICAL:                Normal external female genitalia.  The BUS is negative.  The vagina is pink and moist.  The cervix is without lesions.  The uterus and anterior fibroids was palpable, with slightly irregular uterus at approximately 12 weeks size.  Adnexa not palpable.  Rectovaginal examination is confirmatory for a hemorrhoid.  ASSESSMENT:                   A 60 year old with menorrhagia and multifibroid uterus who presents for definitive surgery.  PLAN:                         She was offered treatment with either Micronor or Depo-Provera to rid her lining, however, feels that she would rather go ahead and have definitive surgery done.  She, fortunately, is note anemic. Because of her age, we will remove her ovaries.  The risks and benefits of the procedure were discussed with the patient.  She was reminded to stop her Aleve the week before surgery.  We will proceed with surgery in the morning. Dictated by:   Douglass Rivers, M.D. Attending:  Douglass Rivers, M.D. DD:  12/24/00 TD:  12/24/00 Job: [id number]

## 2010-07-29 NOTE — Discharge Summary (Signed)
[name], Roberta NO.:  [id number]   MEDICAL RECORD NO.:  [id number]          PATIENT TYPE:  INP   LOCATION:  5041                         FACILITY:  [facility]   PHYSICIAN:  Lubertha Basque. Dalldorf, M.D.DATE OF BIRTH:  1950-06-23   DATE OF ADMISSION:  09/24/2007  DATE OF DISCHARGE:  09/26/2007                               DISCHARGE SUMMARY   ADMITTING DIAGNOSES:  1. Left knee end-stage degenerative joint disease.  2. Right knee degenerative joint disease to a lesser degree.  3. Hypertension.   DISCHARGE DIAGNOSES:  1. Left knee end-stage degenerative joint disease.  2. Right knee degenerative joint disease to a lesser degree.  3. Hypertension.   OPERATION:  Left total knee replacement with injection of right knee  with corticosteroid.   BRIEF HISTORY:  Ms. Pohlman is a 60 year old black female patient well-  known to our practice who over the years has undergone multiple  treatments for left knee pain which had included nonsteroidal anti-  inflammatory drugs, injection periodically of corticosteroids Synvisc or  Viscous supplemented injections and then knee arthroscopy.  Her x-rays  reveal end-stage DJD and I have discussed with her additional treatment  options which included total knee replacement, the risk of anesthesia,  infection, DVT and possible death.   PERTINENT LABORATORY AND X-RAY FINDINGS:  Hemoglobin 10.0, hematocrit  29.6, and WBC is 14.8.  Sodium 140, potassium 4.3, BUN 21, creatinine  1.02, last INR 1.7.  She was on low-dose Coumadin DVT prophylaxis  protocol per pharmacy.   COURSE IN THE HOSPITAL:  She was admitted postoperatively, placed on a  variety of p.o. and IM analgesics for discomfort.  A PCA morphine pump  was used as well IV Ancef a gram q.8 h x3 doses.  She had knee-high TED,  incentive spirometry, physical therapy for up and out of bed,  weightbearing as tolerated and then pharmacy protocol for Lovenox and  Coumadin DVT  prophylaxis.  The first day postop, she was doing quite  well.  PCA pump was controlling her pain.  She had a Foley catheter  which was discontinued that day.  Her blood pressure was 111/72, heart  rate 90, temperature 99.3.  The second day postop, her vital signs  remained stable.  Her dressing was changed.  Her wound was noted to be  benign.  No sign of infection or irritation.  Normal neurovascular  status to her both lower extremities and she was working well with  therapy, was up ambulating in the hallway and wanted to be discharged  home.   CONDITION ON DISCHARGE:  Improved.   FOLLOW-UP:  She would be on a low-sodium heart-healthy diet.  May change  her dressing daily.  She will have home physical therapy implemented by  [facility] for weightbearing as tolerated and then home blood  draws for INRs by [facility].  She was kept on her home  medications which were Claritin 10 mg one a day, Coumadin dose regulated  by pharmacy and this should go for 2 weeks, ferrous sulfate 325 one a  day, Percocet  one or  two q. 4-6 p.r.n. pain, Norvasc 10 mg one a day.  Any sign of  infection, she is to call our office at [phone] and get in contact with  [place], as well as at the same number for an appointment in 10 days.  She  would be weightbearing as tolerated and no use of a knee brace.      Lindwood Qua, P.A.      Lubertha Basque Jerl Santos, M.D.  Electronically Signed    MC/MEDQ  D:  09/27/2007  T:  09/27/2007  Job:  [id number]

## 2010-07-29 NOTE — Discharge Summary (Signed)
[facility]  Patient:    [name], [name] Visit Number: [id number] MRN: [id number]          Service Type: GYN Location: [id number] Attending Physician:  Douglass Rivers Dictated by:   Antony Contras, [facility] Admit Date:  12/25/2000 Discharge Date: 12/28/2000                             Discharge Summary  DISCHARGE DIAGNOSES: 1. Symptomatic fibroid uterus. 2. Menorrhagia.  PROCEDURES: 1. Total abdominal hysterectomy. 2. Bilateral salpingo-oophorectomy. 3. Myomectomy.  HISTORY OF PRESENT ILLNESS:  The patient is a 60 year old, gravida 5, para 2-0-3-2, with a history of irregular menses, menorrhagia, pressure, and clots. She was also diagnosed with an abnormal thyroid, but is status post partial thyroidectomy, and thyroid has been normal since then.  She also was diagnosed with fibroids in the last year.  Had an ultrasound done which showed her uterus to be enlarged at 12.2 x 6.7 with multiple fibroids, the largest of which was 4 cm.  The ovaries were unremarkable, except that the left ovary was displaced posteriorly by the fibroids.  PAST MEDICAL HISTORY: 1. Hot nodule, status post thyroidectomy in August 2001. 2. Chronic hypertension. 3. Arthritis. 4. History of two previous cesarean sections.  CURRENT MEDICATIONS: 1. Cardura q.d. 2. Triamterene q.d. 3. Aleve q.d. for arthritis.  The patient was offered treatment with either ______ or Depo-Provera, but preferred to proceed with definitive therapy.  HOSPITAL COURSE:  The patient was taken to the OR on 12/25/00, for a total abdominal hysterectomy and bilateral salpingo-oophorectomy and a myomectomy was performed by Dr. Farrel Gobble, assisted by Dr. ______, under general anesthesia.  Findings included a multi-fibroid uterus, normal ovaries, tubes had evidence of previous bilateral tubal ligation.  Postoperatively, the patient did have a temperature elevation up to 101, which was felt to  be secondary to atelectasis.  She was anxious on her third postoperative day to be discharged, so she was discharged in satisfactory condition at that time. Postoperative CBC:  Hematocrit 31.1, hemoglobin 10.8, white blood cell count 9.9, platelets 222.  DISPOSITION:  The patient is to follow up early in the following week for staple removal.  Check temperature at home 3 to 4 times a day, and call if elevated.  DISCHARGE MEDICATIONS: 1. Tylox p.r.n. pain. 2. Motrin p.r.n. pain. Dictated by:   Antony Contras, [facility] Attending Physician:  Douglass Rivers DD:  01/11/01 TD:  01/14/01 Job: [id number]

## 2010-08-03 ENCOUNTER — Other Ambulatory Visit (HOSPITAL_BASED_OUTPATIENT_CLINIC_OR_DEPARTMENT_OTHER): Payer: Self-pay | Admitting: Rheumatology

## 2010-08-03 DIAGNOSIS — M25559 Pain in unspecified hip: Secondary | ICD-10-CM

## 2010-08-05 ENCOUNTER — Ambulatory Visit (HOSPITAL_BASED_OUTPATIENT_CLINIC_OR_DEPARTMENT_OTHER)
Admission: RE | Admit: 2010-08-05 | Discharge: 2010-08-05 | Disposition: A | Discharge status: Home / Self Care | Payer: BC Managed Care – HMO | Source: Ambulatory Visit | Attending: Rheumatology | Admitting: Rheumatology

## 2010-08-05 DIAGNOSIS — M25559 Pain in unspecified hip: Secondary | ICD-10-CM | POA: Insufficient documentation

## 2010-08-05 DIAGNOSIS — M545 Low back pain, unspecified: Secondary | ICD-10-CM | POA: Insufficient documentation

## 2010-08-05 DIAGNOSIS — M47817 Spondylosis without myelopathy or radiculopathy, lumbosacral region: Secondary | ICD-10-CM | POA: Insufficient documentation

## 2010-12-08 LAB — BASIC METABOLIC PANEL
BUN: 14
CO2: 24
Calcium: 10.1
Creatinine, Ser: 0.86
Glucose, Bld: 101 — ABNORMAL HIGH

## 2010-12-08 LAB — PROTIME-INR
INR: 1
Prothrombin Time: 13.8

## 2010-12-08 LAB — CBC
Hemoglobin: 13.5
MCHC: 34.2
Platelets: 263
RDW: 12.5

## 2010-12-09 LAB — BASIC METABOLIC PANEL
BUN: 21
BUN: 32 — ABNORMAL HIGH
Calcium: 9.5
Chloride: 103
Chloride: 99
Creatinine, Ser: 1.61 — ABNORMAL HIGH
GFR calc non Af Amer: 56 — ABNORMAL LOW
Glucose, Bld: 139 — ABNORMAL HIGH
Potassium: 4.3
Sodium: 140

## 2010-12-09 LAB — PROTIME-INR
INR: 1
INR: 1.7 — ABNORMAL HIGH
Prothrombin Time: 13.1

## 2010-12-09 LAB — CBC
HCT: 29.6 — ABNORMAL LOW
Hemoglobin: 10 — ABNORMAL LOW
Hemoglobin: 11.3 — ABNORMAL LOW
MCHC: 33.8
MCHC: 34.5
MCV: 90.1
MCV: 90.4
Platelets: 215
RBC: 3.62 — ABNORMAL LOW
RDW: 12.4
WBC: 13.9 — ABNORMAL HIGH
WBC: 14.8 — ABNORMAL HIGH

## 2010-12-29 LAB — BASIC METABOLIC PANEL
CO2: 27
Calcium: 10
Chloride: 105
GFR calc Af Amer: >60
Glucose, Bld: 121 — ABNORMAL HIGH
Sodium: 139

## 2010-12-29 LAB — POCT HEMOGLOBIN-HEMACUE: Hemoglobin: 14

## 2011-07-12 ENCOUNTER — Encounter: Payer: No Typology Code available for payment source | Attending: Internal Medicine | Admitting: *Deleted

## 2011-07-12 ENCOUNTER — Encounter: Payer: Self-pay | Admitting: *Deleted

## 2011-07-12 DIAGNOSIS — Z713 Dietary counseling and surveillance: Secondary | ICD-10-CM | POA: Insufficient documentation

## 2011-07-12 DIAGNOSIS — E119 Type 2 diabetes mellitus without complications: Secondary | ICD-10-CM | POA: Insufficient documentation

## 2011-07-12 NOTE — Patient Instructions (Addendum)
Goals:  Follow Diabetes Meal Plan as instructed (yellow card)  Eat 3 meals and 2 snacks, every 3-5 hrs  Limit carbohydrate intake to 30-45 grams carbohydrate/meal  Limit carbohydrate intake to 15 grams carbohydrate/snack  Add lean protein foods to meals/snacks  Aim for 30 mins of physical activity daily

## 2011-07-12 NOTE — Progress Notes (Signed)
  Medical Nutrition Therapy:  Appt start time: 1615   End time:  1715.  Assessment:  HTN, T2DM, Hyperlipidemia.  Pt with recent A1c of 7.2% and dx of T2DM here for assessment. States MD has not started medication yet; is giving her 3 months to see if she can control with diet and exercise.  Pt is very knowledgeable about desirable food choices for a DM diet, though states "I just need to do it".  Has not been instructed to check BG at this time.   MEDICATIONS: See medication list.   DIETARY INTAKE:  Usual eating pattern includes 3 meals and 0 snacks per day. Has stopped eating bread and potatoes. Intake includes whole eggs (2 d/wk), egg whites (2 d/wk), [place] burgers, tilapia, tuna. Pt is lactose intolerant and drinks 2% lactose free milk. No sweets noted.   Estimated energy needs: 1300-1400 calories 145-160 g carbohydrates 90-105 g protein 35-40 g fat  Progress Towards Goal(s):  In progress.   Nutritional Diagnosis:  [place]-2.1 Impaired nutrient utilization related to glucose metabolism as evidenced by a recent A1c of 7.2% and a new diagnosis of T2DM.    Intervention:  Nutrition education/reinforcement.  Patient Goals:  Follow Diabetes Meal Plan as instructed (yellow card)  Eat 3 meals and 2 snacks, every 3-5 hrs  Limit carbohydrate intake to 30-45 grams carbohydrate/meal  Limit carbohydrate intake to 15 grams carbohydrate/snack  Add lean protein foods to meals/snacks  Aim for 30 mins of physical activity daily  Handouts given during visit include:  Living Well with Diabetes - [employer]  Low [employer] List  [employer] Diabetic Cookbook  Monitoring/Evaluation:  Dietary intake, exercise, A1c, and body weight in 6 week(s).

## 2011-07-13 ENCOUNTER — Encounter: Payer: Self-pay | Admitting: *Deleted

## 2011-09-13 ENCOUNTER — Ambulatory Visit: Payer: No Typology Code available for payment source | Admitting: *Deleted

## 2012-03-10 ENCOUNTER — Emergency Department (INDEPENDENT_AMBULATORY_CARE_PROVIDER_SITE_OTHER)
Admission: EM | Admit: 2012-03-10 | Discharge: 2012-03-10 | Disposition: A | Discharge status: Home / Self Care | Payer: No Typology Code available for payment source | Source: Home / Self Care | Attending: Emergency Medicine | Admitting: Emergency Medicine

## 2012-03-10 ENCOUNTER — Encounter (HOSPITAL_COMMUNITY): Payer: Self-pay | Admitting: Emergency Medicine

## 2012-03-10 DIAGNOSIS — N390 Urinary tract infection, site not specified: Secondary | ICD-10-CM

## 2012-03-10 LAB — POCT URINALYSIS DIP (DEVICE)
Bilirubin Urine: NEGATIVE
Glucose, UA: NEGATIVE mg/dL
Ketones, ur: NEGATIVE mg/dL
Specific Gravity, Urine: 1.015 (ref 1.005–1.030)

## 2012-03-10 MED ORDER — CEPHALEXIN 500 MG PO CAPS: 500.0000 mg | ORAL_CAPSULE | Freq: Four times a day (QID) | ORAL | Status: DC

## 2012-03-10 NOTE — ED Provider Notes (Signed)
History     CSN: [id number]  Arrival date & time 03/10/12  1050   First MD Initiated Contact with Patient 03/10/12 1118      Chief Complaint  Patient presents with  . Urinary Tract Infection    frequency. urgency. burning with urinating. since last night. hx bladder infections    (Consider location/radiation/quality/duration/timing/severity/associated sxs/prior treatment) Patient is a 61 y.o. female presenting with dysuria. The history is provided by the patient. No language interpreter was used.  Dysuria  This is a new problem. The current episode started yesterday. The problem occurs every urination. The problem has not changed since onset.The quality of the pain is described as burning. The pain is at a severity of 5/10. The pain is moderate. There has been no fever. Associated symptoms include vomiting. She has tried nothing for the symptoms. Her past medical history does not include recurrent UTIs.    Past Medical History  Diagnosis Date  . Diabetes mellitus 2012  . Obesity   . Hyperlipidemia   . Hypertension     Past Surgical History  Procedure Date  . Cesarean section 1976, 1984  . Thyroidectomy, partial 2001  . Total knee arthroplasty 2007, 2008  . Appendectomy 2009  . Rotator cuff repair 2004  . Abdominal hysterectomy 2004  . Trigger finger release 2001    Family History  Problem Relation Age of Onset  . Diabetes Mother   . Heart disease Mother   . Alcohol abuse Father     History  Substance Use Topics  . Smoking status: Never Smoker   . Smokeless tobacco: Not on file  . Alcohol Use: Not on file    OB History    Grav Para Term Preterm Abortions TAB SAB Ect Mult Living                  Review of Systems  Gastrointestinal: Positive for vomiting.  Genitourinary: Positive for dysuria.    Allergies  Review of patient's allergies indicates no known allergies.  Home Medications   Current Outpatient Rx  Name  Route  Sig  Dispense  Refill  .  AMLODIPINE BESYLATE 10 MG PO TABS   Oral   Take 10 mg by mouth daily.         . ATORVASTATIN CALCIUM 40 MG PO TABS   Oral   Take 40 mg by mouth daily.         Marland Kitchen BENAZEPRIL-HYDROCHLOROTHIAZIDE 20-12.5 MG PO TABS   Oral   Take 1 tablet by mouth daily.         Marland Kitchen HYDROCODONE-ACETAMINOPHEN 5-500 MG PO TABS   Oral   Take 1 tablet by mouth 2 (two) times daily as needed.         Malachy Mood FOR HER 50+ PO   Oral   Take 1 tablet by mouth daily.         Marland Kitchen CALCIUM CARBONATE 600 MG PO TABS   Oral   Take 600 mg by mouth daily.         . CYCLOBENZAPRINE HCL 10 MG PO TABS   Oral   Take 10 mg by mouth 3 (three) times daily as needed.         . OMEGA-3 FATTY ACIDS 1000 MG PO CAPS   Oral   Take 1,200 mg by mouth daily.         . MELOXICAM 15 MG PO TABS   Oral   Take 15 mg by mouth daily.         Marland Kitchen  ESTROVEN PO   Oral   Take 1 tablet by mouth daily.           BP 176/119  Pulse 92  Temp 98.6 F (37 C) (Oral)  Resp 19  SpO2 95%  Physical Exam  Nursing note and vitals reviewed. Constitutional: She appears well-developed and well-nourished.  HENT:  Head: Normocephalic and atraumatic.  Eyes: Pupils are equal, round, and reactive to light.  Neck: Normal range of motion. Neck supple.  Cardiovascular: Normal rate and normal heart sounds.   Pulmonary/Chest: Effort normal and breath sounds normal.  Abdominal: Soft. Bowel sounds are normal.  Musculoskeletal: Normal range of motion.  Neurological: She is alert.  Skin: Skin is warm.    ED Course  Procedures (including critical care time)  Labs Reviewed - No data to display No results found.   1. UTI (lower urinary tract infection)       MDM     Nitrate positive large leukocytes    Lonia Skinner [place], [place] 03/10/12 1147  Lonia Skinner [place], [place] 03/10/12 1148

## 2012-03-10 NOTE — ED Notes (Signed)
Pt states that she has been experiencing urgency, frequency, incontinence, burning with urinating since last night. Pt has a history of bladder infection.

## 2012-03-10 NOTE — ED Provider Notes (Signed)
Medical screening examination/treatment/procedure(s) were performed by non-physician practitioner and as supervising physician I was immediately available for consultation/collaboration.  Leslee Home, M.D.   Reuben Likes, MD 03/10/12 [phone]

## 2012-04-14 ENCOUNTER — Emergency Department (INDEPENDENT_AMBULATORY_CARE_PROVIDER_SITE_OTHER)
Admission: EM | Admit: 2012-04-14 | Discharge: 2012-04-14 | Disposition: A | Discharge status: Home / Self Care | Payer: No Typology Code available for payment source | Source: Home / Self Care | Attending: Emergency Medicine | Admitting: Emergency Medicine

## 2012-04-14 ENCOUNTER — Encounter (HOSPITAL_COMMUNITY): Payer: Self-pay

## 2012-04-14 DIAGNOSIS — J329 Chronic sinusitis, unspecified: Secondary | ICD-10-CM

## 2012-04-14 DIAGNOSIS — J310 Chronic rhinitis: Secondary | ICD-10-CM

## 2012-04-14 DIAGNOSIS — B354 Tinea corporis: Secondary | ICD-10-CM

## 2012-04-14 MED ORDER — FEXOFENADINE-PSEUDOEPHED ER 60-120 MG PO TB12: 1.0000 | ORAL_TABLET | Freq: Two times a day (BID) | ORAL | Status: AC

## 2012-04-14 MED ORDER — KETOCONAZOLE 2 % EX CREA: TOPICAL_CREAM | Freq: Two times a day (BID) | CUTANEOUS | Status: DC

## 2012-04-14 NOTE — ED Notes (Signed)
Patient complains of a lot of congestion, ear fullness/pain, sinus issues x 5 days and a rash on the left side if her neck that comes and goes for approx one month

## 2012-04-14 NOTE — ED Provider Notes (Signed)
History     CSN: [id number]  Arrival date & time 04/14/12  1451   First MD Initiated Contact with Patient 04/14/12 1511      Chief Complaint  Patient presents with  . Nasal Congestion    (Consider location/radiation/quality/duration/timing/severity/associated sxs/prior treatment) HPI Comments: Patient presents urgent care complaining of approximately 4-5 days with a congested nose intermittent left-sided ear pain. And sinus congestion with some postnasal dripping. She has been using Claritin and started today using a previous prescription given to her for nasal fluticasone spray. She denies any fevers or cough or shortness of breath. Patient also describes it for several days she has had a rash on the right side of her neck that has gotten progressively worse during the course of the last 3-4 weeks. It is itchy and initially it looked just like a "ring worm" she has a dog but also had a skin infection and he typically sleeps in her bed. She did use some over-the-counter Lotrimin with some partial results but the rash has gotten worse now    The history is provided by the patient.    Past Medical History  Diagnosis Date  . Diabetes mellitus 2012  . Obesity   . Hyperlipidemia   . Hypertension     Past Surgical History  Procedure Date  . Cesarean section 1976, 1984  . Thyroidectomy, partial 2001  . Total knee arthroplasty 2007, 2008  . Appendectomy 2009  . Rotator cuff repair 2004  . Abdominal hysterectomy 2004  . Trigger finger release 2001    Family History  Problem Relation Age of Onset  . Diabetes Mother   . Heart disease Mother   . Alcohol abuse Father     History  Substance Use Topics  . Smoking status: Never Smoker   . Smokeless tobacco: Not on file  . Alcohol Use: Not on file    OB History    Grav Para Term Preterm Abortions TAB SAB Ect Mult Living                  Review of Systems  Constitutional: Negative for fever, chills, activity change and  appetite change.  HENT: Positive for ear pain, congestion and sinus pressure. Negative for hearing loss, nosebleeds, sneezing, drooling, neck pain, neck stiffness, dental problem, postnasal drip, tinnitus and ear discharge.   Eyes: Negative for pain.  Respiratory: Negative for cough and shortness of breath.   Skin: Positive for rash. Negative for color change and wound.  Neurological: Negative for dizziness and headaches.    Allergies  Review of patient's allergies indicates no known allergies.  Home Medications   Current Outpatient Rx  Name  Route  Sig  Dispense  Refill  . AMLODIPINE BESYLATE 10 MG PO TABS   Oral   Take 10 mg by mouth daily.         . ATORVASTATIN CALCIUM 40 MG PO TABS   Oral   Take 40 mg by mouth daily.         Marland Kitchen BENAZEPRIL-HYDROCHLOROTHIAZIDE 20-12.5 MG PO TABS   Oral   Take 1 tablet by mouth daily.         Marland Kitchen CALCIUM CARBONATE 600 MG PO TABS   Oral   Take 600 mg by mouth daily.         . CYCLOBENZAPRINE HCL 10 MG PO TABS   Oral   Take 10 mg by mouth 3 (three) times daily as needed.         Marland Kitchen  OMEGA-3 FATTY ACIDS 1000 MG PO CAPS   Oral   Take 1,200 mg by mouth daily.         Marland Kitchen GINGER ROOT PO   Oral   Take by mouth daily.         Marland Kitchen HYDROCODONE-ACETAMINOPHEN 5-325 MG PO TABS   Oral   Take 1 tablet by mouth 2 (two) times daily. one tablet in the am and one tablet pm         . MELOXICAM 15 MG PO TABS   Oral   Take 15 mg by mouth daily.         Malachy Mood FOR HER 50+ PO   Oral   Take 1 tablet by mouth daily.         Marland Kitchen ESTROVEN PO   Oral   Take 1 tablet by mouth daily.         . CEPHALEXIN 500 MG PO CAPS   Oral   Take 1 capsule (500 mg total) by mouth 4 (four) times daily.   40 capsule   0   . FEXOFENADINE-PSEUDOEPHED ER 60-120 MG PO TB12   Oral   Take 1 tablet by mouth 2 (two) times daily.   10 tablet   0   . HYDROCODONE-ACETAMINOPHEN 5-500 MG PO TABS   Oral   Take 1 tablet by mouth 2 (two) times daily as  needed.         Marland Kitchen KETOCONAZOLE 2 % EX CREA   Topical   Apply topically 2 (two) times daily. Apply bid x 3 weeks   30 g   0     BP 162/84  Pulse 64  Temp 98.5 F (36.9 C) (Oral)  Resp 18  SpO2 95%  Physical Exam  Constitutional: Vital signs are normal. She appears well-developed and well-nourished.  Non-toxic appearance. She does not have a sickly appearance. She does not appear ill. No distress.  HENT:  Head: Normocephalic.  Right Ear: Tympanic membrane normal.  Left Ear: Tympanic membrane normal.  Nose: No rhinorrhea, nose lacerations or sinus tenderness.  Mouth/Throat: Uvula is midline and oropharynx is clear and moist. No oropharyngeal exudate, posterior oropharyngeal edema, posterior oropharyngeal erythema or tonsillar abscesses.  Eyes: Conjunctivae normal are normal. Pupils are equal, round, and reactive to light.  Neck: Normal range of motion. Neck supple.  Pulmonary/Chest: Effort normal and breath sounds normal. She has no decreased breath sounds. She has no wheezes. She has no rhonchi. She has no rales.  Neurological: She is alert.  Skin: Skin is warm. Rash noted. Rash is macular. There is erythema.       ED Course  Procedures (including critical care time)  Labs Reviewed - No data to display No results found.   1. Rhinitis   2. Sinusitis   3. Tinea corporis       MDM  Problem #1. Sinus congestion and rhinitis. Patient was encouraged to use Allegra-D for no more than 5 days and to continue with nasal fluticasone.  Problem #2 out of 5 tinea corporis? Patient has a erythema ovoid shaped rash of about 5 cm lateral aspect of her right neck will treat empirically with Nizoral cream for 3 weeks encouraged patient to followup apparently care Dr. if rash does not improve.        Jimmie Molly, MD 04/14/12 [phone]

## 2013-08-18 ENCOUNTER — Other Ambulatory Visit: Payer: Self-pay

## 2013-08-18 DIAGNOSIS — Z1231 Encounter for screening mammogram for malignant neoplasm of breast: Secondary | ICD-10-CM

## 2013-08-27 ENCOUNTER — Ambulatory Visit
Admission: RE | Admit: 2013-08-27 | Discharge: 2013-08-27 | Disposition: A | Discharge status: Home / Self Care | Payer: 59 | Source: Ambulatory Visit

## 2013-08-27 ENCOUNTER — Encounter (INDEPENDENT_AMBULATORY_CARE_PROVIDER_SITE_OTHER): Payer: Self-pay

## 2013-08-27 DIAGNOSIS — Z1231 Encounter for screening mammogram for malignant neoplasm of breast: Secondary | ICD-10-CM

## 2014-05-31 ENCOUNTER — Emergency Department (INDEPENDENT_AMBULATORY_CARE_PROVIDER_SITE_OTHER)
Admission: EM | Admit: 2014-05-31 | Discharge: 2014-05-31 | Disposition: A | Discharge status: Home / Self Care | Payer: BLUE CROSS/BLUE SHIELD | Source: Home / Self Care | Attending: Family Medicine | Admitting: Family Medicine

## 2014-05-31 ENCOUNTER — Encounter (HOSPITAL_COMMUNITY): Payer: Self-pay | Admitting: *Deleted

## 2014-05-31 DIAGNOSIS — J40 Bronchitis, not specified as acute or chronic: Secondary | ICD-10-CM | POA: Diagnosis not present

## 2014-05-31 MED ORDER — PREDNISONE 10 MG PO TABS: 30.0000 mg | ORAL_TABLET | Freq: Every day | ORAL | Status: DC

## 2014-05-31 MED ORDER — HYDROCODONE-HOMATROPINE 5-1.5 MG/5ML PO SYRP: 5.0000 mL | ORAL_SOLUTION | Freq: Four times a day (QID) | ORAL | Status: DC | PRN

## 2014-05-31 MED ORDER — ALBUTEROL SULFATE HFA 108 (90 BASE) MCG/ACT IN AERS: 2.0000 | INHALATION_SPRAY | Freq: Four times a day (QID) | RESPIRATORY_TRACT | Status: DC | PRN

## 2014-05-31 MED ORDER — SODIUM CHLORIDE 0.9 % IN NEBU
INHALATION_SOLUTION | RESPIRATORY_TRACT | Status: AC
  Filled 2014-05-31: qty 3

## 2014-05-31 MED ORDER — IPRATROPIUM-ALBUTEROL 0.5-2.5 (3) MG/3ML IN SOLN
RESPIRATORY_TRACT | Status: AC
  Filled 2014-05-31: qty 3

## 2014-05-31 MED ORDER — IPRATROPIUM-ALBUTEROL 0.5-2.5 (3) MG/3ML IN SOLN
3.0000 mL | Freq: Once | RESPIRATORY_TRACT | Status: AC
  Administered 2014-05-31: 3 mL via RESPIRATORY_TRACT

## 2014-05-31 NOTE — Discharge Instructions (Signed)
Thank you for coming in today. Call or go to the emergency room if you get worse, have trouble breathing, have chest pains, or palpitations.   Do not drive after taking hydrocodone cough medicine.  Acute Bronchitis Bronchitis is inflammation of the airways that extend from the windpipe into the lungs (bronchi). The inflammation often causes mucus to develop. This leads to a cough, which is the most common symptom of bronchitis.  In acute bronchitis, the condition usually develops suddenly and goes away over time, usually in a couple weeks. Smoking, allergies, and asthma can make bronchitis worse. Repeated episodes of bronchitis may cause further lung problems.  CAUSES Acute bronchitis is most often caused by the same virus that causes a cold. The virus can spread from person to person (contagious) through coughing, sneezing, and touching contaminated objects. SIGNS AND SYMPTOMS   Cough.   Fever.   Coughing up mucus.   Body aches.   Chest congestion.   Chills.   Shortness of breath.   Sore throat.  DIAGNOSIS  Acute bronchitis is usually diagnosed through a physical exam. Your health care provider will also ask you questions about your medical history. Tests, such as chest X-rays, are sometimes done to rule out other conditions.  TREATMENT  Acute bronchitis usually goes away in a couple weeks. Oftentimes, no medical treatment is necessary. Medicines are sometimes given for relief of fever or cough. Antibiotic medicines are usually not needed but may be prescribed in certain situations. In some cases, an inhaler may be recommended to help reduce shortness of breath and control the cough. A cool mist vaporizer may also be used to help thin bronchial secretions and make it easier to clear the chest.  HOME CARE INSTRUCTIONS  Get plenty of rest.   Drink enough fluids to keep your urine clear or pale yellow (unless you have a medical condition that requires fluid restriction).  Increasing fluids may help thin your respiratory secretions (sputum) and reduce chest congestion, and it will prevent dehydration.   Take medicines only as directed by your health care provider.  If you were prescribed an antibiotic medicine, finish it all even if you start to feel better.  Avoid smoking and secondhand smoke. Exposure to cigarette smoke or irritating chemicals will make bronchitis worse. If you are a smoker, consider using nicotine gum or skin patches to help control withdrawal symptoms. Quitting smoking will help your lungs heal faster.   Reduce the chances of another bout of acute bronchitis by washing your hands frequently, avoiding people with cold symptoms, and trying not to touch your hands to your mouth, nose, or eyes.   Keep all follow-up visits as directed by your health care provider.  SEEK MEDICAL CARE IF: Your symptoms do not improve after 1 week of treatment.  SEEK IMMEDIATE MEDICAL CARE IF:  You develop an increased fever or chills.   You have chest pain.   You have severe shortness of breath.  You have bloody sputum.   You develop dehydration.  You faint or repeatedly feel like you are going to pass out.  You develop repeated vomiting.  You develop a severe headache. MAKE SURE YOU:   Understand these instructions.  Will watch your condition.  Will get help right away if you are not doing well or get worse. Document Released: 04/06/2004 Document Revised: 07/14/2013 Document Reviewed: 08/20/2012 [facility] Patient Information 2015 [place], [place]. This information is not intended to replace advice given to you by your health care provider. Make  sure you discuss any questions you have with your health care provider.

## 2014-05-31 NOTE — ED Notes (Signed)
Breathing treatment complete.  States breathing feels better; cough now productive.

## 2014-05-31 NOTE — ED Notes (Signed)
Breathing treatment in progress

## 2014-05-31 NOTE — ED Provider Notes (Signed)
[name] is a 64 y.o. female who presents to Urgent Care today for cough nasal congestion. Symptoms present for a few days. Patient notes the cough is quite bothersome at bedtime. The cough is minimally productive. No chest pains or palpitations. Her sister is currently in the ICU with pneumonia. She's tried Tessalon, Flonase spray and Claritin which have helped a little. No fevers or chills vomiting or diarrhea.   Past Medical History  Diagnosis Date  . Diabetes mellitus 2012  . Obesity   . Hyperlipidemia   . Hypertension    Past Surgical History  Procedure Laterality Date  . Cesarean section  1976, 1984  . Thyroidectomy, partial  2001  . Total knee arthroplasty  2007, 2008  . Appendectomy  2009  . Rotator cuff repair  2004  . Abdominal hysterectomy  2004  . Trigger finger release  2001   History  Substance Use Topics  . Smoking status: Former Research scientist (life sciences)  . Smokeless tobacco: Not on file  . Alcohol Use: No   ROS as above Medications: No current facility-administered medications for this encounter.   Current Outpatient Prescriptions  Medication Sig Dispense Refill  . amLODipine-benazepril (LOTREL) 10-20 MG per capsule Take 1 capsule by mouth daily.    Marland Kitchen atorvastatin (LIPITOR) 40 MG tablet Take 40 mg by mouth daily.    . calcium carbonate (OS-CAL) 600 MG TABS Take 600 mg by mouth daily.    . carvedilol (COREG) 25 MG tablet Take 25 mg by mouth 2 (two) times daily with a meal.     . chlorthalidone (HYGROTON) 25 MG tablet Take 12.5 mg by mouth daily.     . cyclobenzaprine (FLEXERIL) 10 MG tablet Take 10 mg by mouth 3 (three) times daily as needed.    . fish oil-omega-3 fatty acids 1000 MG capsule Take 1,200 mg by mouth daily.    . Ginger, Zingiber officinalis, (GINGER ROOT PO) Take by mouth daily.    . metFORMIN (GLUCOPHAGE) 500 MG tablet Take by mouth daily.    . Misc Natural Products (TART CHERRY ADVANCED PO) Take by mouth.    . Multiple Vitamins-Minerals (MULTI FOR HER 50+  PO) Take 1 tablet by mouth daily.    . Nutritional Supplements (ESTROVEN PO) Take 1 tablet by mouth daily.    . TURMERIC PO Take by mouth.    Marland Kitchen albuterol (PROVENTIL HFA;VENTOLIN HFA) 108 (90 BASE) MCG/ACT inhaler Inhale 2 puffs into the lungs every 6 (six) hours as needed for wheezing or shortness of breath. 1 Inhaler 2  . HYDROcodone-homatropine (HYCODAN) 5-1.5 MG/5ML syrup Take 5 mLs by mouth every 6 (six) hours as needed for cough. 120 mL 0  . predniSONE (DELTASONE) 10 MG tablet Take 3 tablets (30 mg total) by mouth daily. 15 tablet 0  . [DISCONTINUED] amLODipine (NORVASC) 10 MG tablet Take 10 mg by mouth daily.    . [DISCONTINUED] benazepril-hydrochlorthiazide (LOTENSIN HCT) 20-12.5 MG per tablet Take 1 tablet by mouth daily.     No Known Allergies   Exam:  BP 147/75 mmHg  Pulse 86  Temp(Src) 98.5 F (36.9 C) (Oral)  Resp 18  SpO2 95% Gen: Well NAD HEENT: EOMI,  MMM difficult to visualize posterior pharynx. Normal tympanic membranes bilaterally. Lungs: Normal work of breathing. Prolonged expiratory phase bilaterally Heart: RRR no MRG Abd: NABS, Soft. Nondistended, Nontender Exts: Brisk capillary refill, warm and well perfused.   Patient was given a 2.5/0.5 mg DuoNeb nebulizer treatment, and felt better  No results found for this  or any previous visit (from the past 24 hour(s)). No results found.  Assessment and Plan: 64 y.o. female with bronchitis. Treat with prednisone, Hycodan cough syrup and albuterol. Warned patient about risk of hyperglycemia will taking prednisone.  Discussed warning signs or symptoms. Please see discharge instructions. Patient expresses understanding.     Gregor Hams, MD 05/31/14 1218

## 2014-05-31 NOTE — ED Notes (Signed)
C/O sinus pressure and congestion x 3 days with significant postnasal drainage.  Now c/o uncontrollable coughing episodes that are becoming "tighter" now - worse at night.  Has been using Flonase and Tessalon Perles without any relief.  Sister is currently in ICU; pt concerned about being contagious and not getting enough sleep at night due to sxs.

## 2014-08-26 ENCOUNTER — Other Ambulatory Visit: Payer: Self-pay

## 2014-08-26 DIAGNOSIS — Z1231 Encounter for screening mammogram for malignant neoplasm of breast: Secondary | ICD-10-CM

## 2014-08-31 ENCOUNTER — Other Ambulatory Visit: Payer: Self-pay | Admitting: Family Medicine

## 2014-08-31 ENCOUNTER — Other Ambulatory Visit (HOSPITAL_COMMUNITY)
Admission: RE | Admit: 2014-08-31 | Discharge: 2014-08-31 | Disposition: A | Discharge status: Home / Self Care | Payer: Commercial Managed Care - HMO | Source: Ambulatory Visit | Attending: Family Medicine | Admitting: Family Medicine

## 2014-08-31 DIAGNOSIS — Z124 Encounter for screening for malignant neoplasm of cervix: Secondary | ICD-10-CM | POA: Diagnosis present

## 2014-09-03 LAB — CYTOLOGY - PAP

## 2014-09-11 ENCOUNTER — Ambulatory Visit
Admission: RE | Admit: 2014-09-11 | Discharge: 2014-09-11 | Disposition: A | Discharge status: Home / Self Care | Payer: Commercial Managed Care - HMO | Source: Ambulatory Visit

## 2014-09-11 DIAGNOSIS — Z1231 Encounter for screening mammogram for malignant neoplasm of breast: Secondary | ICD-10-CM

## 2015-03-30 ENCOUNTER — Other Ambulatory Visit: Payer: Self-pay

## 2015-03-30 ENCOUNTER — Other Ambulatory Visit: Payer: Self-pay | Admitting: Otolaryngology

## 2015-03-30 DIAGNOSIS — R519 Headache, unspecified: Secondary | ICD-10-CM

## 2015-03-30 DIAGNOSIS — R51 Headache: Principal | ICD-10-CM

## 2015-04-05 ENCOUNTER — Ambulatory Visit
Admission: RE | Admit: 2015-04-05 | Discharge: 2015-04-05 | Disposition: A | Discharge status: Home / Self Care | Payer: Commercial Managed Care - HMO | Source: Ambulatory Visit | Attending: Otolaryngology | Admitting: Otolaryngology

## 2015-04-05 DIAGNOSIS — R51 Headache: Principal | ICD-10-CM

## 2015-04-05 DIAGNOSIS — R519 Headache, unspecified: Secondary | ICD-10-CM

## 2015-10-06 ENCOUNTER — Other Ambulatory Visit: Payer: Self-pay | Admitting: Family Medicine

## 2015-10-06 DIAGNOSIS — Z1231 Encounter for screening mammogram for malignant neoplasm of breast: Secondary | ICD-10-CM

## 2015-10-18 ENCOUNTER — Ambulatory Visit
Admission: RE | Admit: 2015-10-18 | Discharge: 2015-10-18 | Disposition: A | Discharge status: Home / Self Care | Payer: Commercial Managed Care - HMO | Source: Ambulatory Visit | Attending: Family Medicine | Admitting: Family Medicine

## 2015-10-18 DIAGNOSIS — Z1231 Encounter for screening mammogram for malignant neoplasm of breast: Secondary | ICD-10-CM

## 2016-10-19 ENCOUNTER — Other Ambulatory Visit: Payer: Self-pay | Admitting: Family Medicine

## 2016-10-19 DIAGNOSIS — Z1231 Encounter for screening mammogram for malignant neoplasm of breast: Secondary | ICD-10-CM

## 2016-10-30 ENCOUNTER — Ambulatory Visit: Payer: Commercial Managed Care - HMO

## 2016-11-21 ENCOUNTER — Ambulatory Visit: Payer: Commercial Managed Care - HMO

## 2016-12-18 ENCOUNTER — Ambulatory Visit
Admission: RE | Admit: 2016-12-18 | Discharge: 2016-12-18 | Disposition: A | Discharge status: Home / Self Care | Payer: 59 | Source: Ambulatory Visit | Attending: Family Medicine | Admitting: Family Medicine

## 2016-12-18 DIAGNOSIS — Z1231 Encounter for screening mammogram for malignant neoplasm of breast: Secondary | ICD-10-CM

## 2017-12-25 ENCOUNTER — Other Ambulatory Visit: Payer: Self-pay | Admitting: Family Medicine

## 2017-12-25 DIAGNOSIS — Z1231 Encounter for screening mammogram for malignant neoplasm of breast: Secondary | ICD-10-CM

## 2018-01-02 NOTE — Progress Notes (Signed)
Cardiology Office Note   Date:  01/04/2018   ID:  [name], DOB Jul 15, 1950, MRN [id number]  PCP:  Roberta Cruel, MD  Cardiologist:   Roberta Swartzentruber [place], MD   Chief Complaint  Patient presents with  . Abnormal ECG      History of Present Illness: [name] is a 67 y.o. female who presents for evaluation of an abnormal Ecg at the request of [name]. She has a history of DM type 2, HTN, HLD, and obesity. She was seen by me in 2008 with complaints of dyspnea on exertion. She had an adenosine Myoview showing evidence of anterior wall ischemia with normal EF. 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 Risk factor modification recommended.   She states she was being seen by [name] for a routine physical. Ecg done and some abnormality noted. According to patient this was similar to old Ecgs but just wanted this checked out. 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 Really denies any chest pain, dyspnea or palpitations. Has some mild ankle edema ever since she had knee surgery.     Past Medical History:  Diagnosis Date  . Diabetes mellitus 2012  . Hyperlipidemia   . Hypertension   . Obesity     Past Surgical History:  Procedure Laterality Date  . ABDOMINAL HYSTERECTOMY  2004  . APPENDECTOMY  2009  . [place]  . ROTATOR CUFF REPAIR  2004  . THYROIDECTOMY, PARTIAL  2001  . TOTAL KNEE ARTHROPLASTY  2007, 2008  . TRIGGER FINGER RELEASE  2001     Current Outpatient Medications  Medication Sig Dispense Refill  . albuterol (PROVENTIL HFA;VENTOLIN HFA) 108 (90 BASE) MCG/ACT inhaler Inhale 2 puffs into the lungs every 6 (six) hours as needed for wheezing or shortness of breath. 1 Inhaler 2  . amLODipine-benazepril (LOTREL) 10-20 MG per capsule Take 1 capsule by mouth daily.    Marland Kitchen amoxicillin-clavulanate (AUGMENTIN) 875-125 MG tablet Take 1 tablet by mouth every 6 (six) months.  0  . atorvastatin (LIPITOR) 40 MG tablet Take 40  mg by mouth daily.    . calcium carbonate (OS-CAL) 600 MG TABS Take 600 mg by mouth daily.    . carvedilol (COREG) 25 MG tablet Take 25 mg by mouth 2 (two) times daily with a meal.     . chlorthalidone (HYGROTON) 25 MG tablet Take 12.5 mg by mouth daily.     . fish oil-omega-3 fatty acids 1000 MG capsule Take 1,200 mg by mouth daily.    . Ginger, Zingiber officinalis, (GINGER ROOT PO) Take by mouth daily.    . metFORMIN (GLUCOPHAGE) 500 MG tablet Take by mouth daily.    . Misc Natural Products (TART CHERRY ADVANCED PO) Take by mouth.    . Multiple Vitamins-Minerals (MULTI FOR HER 50+ PO) Take 1 tablet by mouth daily.    . Nutritional Supplements (ESTROVEN PO) Take 1 tablet by mouth daily.    . TURMERIC PO Take by mouth.    . gabapentin (NEURONTIN) 300 MG capsule Take 300 mg by mouth daily.  1   No current facility-administered medications for this visit.     Allergies:   Patient has no known allergies.    Social History:  The patient  reports that she has quit smoking. She does not have any smokeless tobacco history on file. She reports that she does not drink alcohol or use drugs.   Family History:  The patient's family history includes Alcohol abuse in her father; Diabetes in her mother; Heart disease in her mother.    ROS:  Please see the history of present illness.   Otherwise, review of systems are positive for none.   All other systems are reviewed and negative.    PHYSICAL EXAM: VS:  BP 110/70   Pulse 65   Ht 5' (1.524 m)   Wt 197 lb 9.6 oz (89.6 kg)   BMI 38.59 kg/m  , BMI Body mass index is 38.59 kg/m. GEN: Well nourished, obsee, in no acute distress  HEENT: normal  Neck: no JVD, carotid bruits, or masses Cardiac: RRR; no murmurs, rubs, or gallops,no edema  Respiratory:  clear to auscultation bilaterally, normal work of breathing GI: soft, nontender, nondistended, + BS MS: no deformity or atrophy  Skin: warm and dry, no rash Neuro:  Strength and sensation are  intact Psych: euthymic mood, full affect   EKG:  EKG is ordered today. The ekg ordered today demonstrates NSR. There is low voltage and poor R wave progression in the anterior precordial leads. I have personally reviewed and interpreted this study.    Recent Labs: No results found for requested labs within last 8760 hours.    Lipid Panel No results found for: CHOL, TRIG, HDL, CHOLHDL, VLDL, LDLCALC, LDLDIRECT   Labs dated 11/29/17: cholesterol 184, triglycerides 159, HDL 46, LDL 106. A1c 7.2%, creatinine 1.13. Potassium, ALT, TSH normal.   Wt Readings from Last 3 Encounters:  01/04/18 197 lb 9.6 oz (89.6 kg)  07/12/11 211 lb (95.7 kg)      Other studies Reviewed: Additional studies/ records that were reviewed today include: see above.   ASSESSMENT AND PLAN:  1.  Abnormal Ecg. My interpretation is that the low voltage noted in the anterior precordial leads is due to her body habitus- she has a large chest and breasts. No evidence of ischemia or infarct. Given prior cardiac evaluation and lack of cardiac symptoms I would not recommend any further cardiac work up at this time. It is most important that she get active and start a regular aerobic exercise program 2. DM type 2 3 HTN 4. HLD on lipitor. Goal LDL < 70 with DM. May want to consider increase statin or adding Zetia. Will defer to [name].    Current medicines are reviewed at length with the patient today.  The patient does not have concerns regarding medicines.  The following changes have been made:  no change  Labs/ tests ordered today include:  No orders of the defined types were placed in this encounter.    Disposition:   FU with me  PRN  Signed, Roberta Colpitts Martin[place], MD  01/04/2018 9:12 AM    [facility] [address], [place], [place], [address] Phone [phone], Fax [phone]

## 2018-01-04 ENCOUNTER — Ambulatory Visit (INDEPENDENT_AMBULATORY_CARE_PROVIDER_SITE_OTHER): Payer: 59 | Admitting: Cardiology

## 2018-01-04 ENCOUNTER — Encounter: Payer: Self-pay | Admitting: Cardiology

## 2018-01-04 VITALS — BP 110/70 | HR 65 | Ht 60.0 in | Wt 197.6 lb

## 2018-01-04 DIAGNOSIS — E119 Type 2 diabetes mellitus without complications: Secondary | ICD-10-CM | POA: Diagnosis not present

## 2018-01-04 DIAGNOSIS — I10 Essential (primary) hypertension: Secondary | ICD-10-CM

## 2018-01-04 DIAGNOSIS — E782 Mixed hyperlipidemia: Secondary | ICD-10-CM

## 2018-01-04 DIAGNOSIS — R9431 Abnormal electrocardiogram [ECG] [EKG]: Secondary | ICD-10-CM | POA: Diagnosis not present

## 2018-02-04 ENCOUNTER — Ambulatory Visit
Admission: RE | Admit: 2018-02-04 | Discharge: 2018-02-04 | Disposition: A | Discharge status: Home / Self Care | Payer: 59 | Source: Ambulatory Visit | Attending: Family Medicine | Admitting: Family Medicine

## 2018-02-04 DIAGNOSIS — Z1231 Encounter for screening mammogram for malignant neoplasm of breast: Secondary | ICD-10-CM

## 2018-09-17 DIAGNOSIS — E119 Type 2 diabetes mellitus without complications: Secondary | ICD-10-CM | POA: Diagnosis not present

## 2018-12-23 DIAGNOSIS — E7800 Pure hypercholesterolemia, unspecified: Secondary | ICD-10-CM | POA: Diagnosis not present

## 2018-12-23 DIAGNOSIS — I10 Essential (primary) hypertension: Secondary | ICD-10-CM | POA: Diagnosis not present

## 2018-12-23 DIAGNOSIS — E1169 Type 2 diabetes mellitus with other specified complication: Secondary | ICD-10-CM | POA: Diagnosis not present

## 2018-12-23 DIAGNOSIS — J309 Allergic rhinitis, unspecified: Secondary | ICD-10-CM | POA: Diagnosis not present

## 2018-12-30 DIAGNOSIS — I10 Essential (primary) hypertension: Secondary | ICD-10-CM | POA: Diagnosis not present

## 2018-12-30 DIAGNOSIS — E7800 Pure hypercholesterolemia, unspecified: Secondary | ICD-10-CM | POA: Diagnosis not present

## 2018-12-30 DIAGNOSIS — E1169 Type 2 diabetes mellitus with other specified complication: Secondary | ICD-10-CM | POA: Diagnosis not present

## 2019-01-20 DIAGNOSIS — D1039 Benign neoplasm of other parts of mouth: Secondary | ICD-10-CM | POA: Diagnosis not present

## 2019-01-28 DIAGNOSIS — N390 Urinary tract infection, site not specified: Secondary | ICD-10-CM | POA: Diagnosis not present

## 2019-02-08 DIAGNOSIS — E119 Type 2 diabetes mellitus without complications: Secondary | ICD-10-CM | POA: Diagnosis not present

## 2019-02-08 DIAGNOSIS — J019 Acute sinusitis, unspecified: Secondary | ICD-10-CM | POA: Diagnosis not present

## 2019-02-08 DIAGNOSIS — J309 Allergic rhinitis, unspecified: Secondary | ICD-10-CM | POA: Diagnosis not present

## 2019-02-08 DIAGNOSIS — R519 Headache, unspecified: Secondary | ICD-10-CM | POA: Diagnosis not present

## 2019-02-25 ENCOUNTER — Other Ambulatory Visit: Payer: Self-pay | Admitting: Family Medicine

## 2019-02-25 DIAGNOSIS — Z1231 Encounter for screening mammogram for malignant neoplasm of breast: Secondary | ICD-10-CM

## 2019-04-16 ENCOUNTER — Other Ambulatory Visit: Payer: Self-pay

## 2019-04-16 ENCOUNTER — Ambulatory Visit
Admission: RE | Admit: 2019-04-16 | Discharge: 2019-04-16 | Disposition: A | Discharge status: Home / Self Care | Payer: BC Managed Care – PPO | Source: Ambulatory Visit | Attending: Family Medicine | Admitting: Family Medicine

## 2019-04-16 DIAGNOSIS — Z1231 Encounter for screening mammogram for malignant neoplasm of breast: Secondary | ICD-10-CM

## 2019-06-09 DIAGNOSIS — M25561 Pain in right knee: Secondary | ICD-10-CM | POA: Diagnosis not present

## 2019-06-09 DIAGNOSIS — Z96652 Presence of left artificial knee joint: Secondary | ICD-10-CM | POA: Diagnosis not present

## 2019-06-09 DIAGNOSIS — Z96651 Presence of right artificial knee joint: Secondary | ICD-10-CM | POA: Diagnosis not present

## 2019-06-09 DIAGNOSIS — M25562 Pain in left knee: Secondary | ICD-10-CM | POA: Diagnosis not present

## 2019-07-01 DIAGNOSIS — E7800 Pure hypercholesterolemia, unspecified: Secondary | ICD-10-CM | POA: Diagnosis not present

## 2019-07-01 DIAGNOSIS — I10 Essential (primary) hypertension: Secondary | ICD-10-CM | POA: Diagnosis not present

## 2019-07-01 DIAGNOSIS — Z8639 Personal history of other endocrine, nutritional and metabolic disease: Secondary | ICD-10-CM | POA: Diagnosis not present

## 2019-07-01 DIAGNOSIS — E1169 Type 2 diabetes mellitus with other specified complication: Secondary | ICD-10-CM | POA: Diagnosis not present

## 2019-07-07 DIAGNOSIS — N183 Chronic kidney disease, stage 3 unspecified: Secondary | ICD-10-CM | POA: Diagnosis not present

## 2019-07-07 DIAGNOSIS — Z0000 Encounter for general adult medical examination without abnormal findings: Secondary | ICD-10-CM | POA: Diagnosis not present

## 2019-07-07 DIAGNOSIS — E1169 Type 2 diabetes mellitus with other specified complication: Secondary | ICD-10-CM | POA: Diagnosis not present

## 2019-07-07 DIAGNOSIS — E7800 Pure hypercholesterolemia, unspecified: Secondary | ICD-10-CM | POA: Diagnosis not present

## 2019-07-07 DIAGNOSIS — Z23 Encounter for immunization: Secondary | ICD-10-CM | POA: Diagnosis not present

## 2019-07-08 ENCOUNTER — Other Ambulatory Visit: Payer: Self-pay | Admitting: Family Medicine

## 2019-07-08 DIAGNOSIS — Z1231 Encounter for screening mammogram for malignant neoplasm of breast: Secondary | ICD-10-CM

## 2019-07-08 DIAGNOSIS — E2839 Other primary ovarian failure: Secondary | ICD-10-CM

## 2019-09-22 DIAGNOSIS — H401111 Primary open-angle glaucoma, right eye, mild stage: Secondary | ICD-10-CM | POA: Diagnosis not present

## 2019-09-22 DIAGNOSIS — E119 Type 2 diabetes mellitus without complications: Secondary | ICD-10-CM | POA: Diagnosis not present

## 2019-10-15 DIAGNOSIS — J3089 Other allergic rhinitis: Secondary | ICD-10-CM | POA: Diagnosis not present

## 2019-10-15 DIAGNOSIS — H1045 Other chronic allergic conjunctivitis: Secondary | ICD-10-CM | POA: Diagnosis not present

## 2019-10-15 DIAGNOSIS — J3081 Allergic rhinitis due to animal (cat) (dog) hair and dander: Secondary | ICD-10-CM | POA: Diagnosis not present

## 2019-10-15 DIAGNOSIS — J309 Allergic rhinitis, unspecified: Secondary | ICD-10-CM | POA: Diagnosis not present

## 2020-01-06 DIAGNOSIS — I10 Essential (primary) hypertension: Secondary | ICD-10-CM | POA: Diagnosis not present

## 2020-01-06 DIAGNOSIS — E1169 Type 2 diabetes mellitus with other specified complication: Secondary | ICD-10-CM | POA: Diagnosis not present

## 2020-01-21 DIAGNOSIS — J300 Vasomotor rhinitis: Secondary | ICD-10-CM | POA: Diagnosis not present

## 2020-01-21 DIAGNOSIS — H1045 Other chronic allergic conjunctivitis: Secondary | ICD-10-CM | POA: Diagnosis not present

## 2020-03-24 DIAGNOSIS — H401111 Primary open-angle glaucoma, right eye, mild stage: Secondary | ICD-10-CM | POA: Diagnosis not present

## 2020-04-02 ENCOUNTER — Other Ambulatory Visit: Payer: Self-pay | Admitting: Family Medicine

## 2020-04-02 DIAGNOSIS — Z780 Asymptomatic menopausal state: Secondary | ICD-10-CM

## 2020-04-07 DIAGNOSIS — H401111 Primary open-angle glaucoma, right eye, mild stage: Secondary | ICD-10-CM | POA: Diagnosis not present

## 2020-04-23 DIAGNOSIS — H401122 Primary open-angle glaucoma, left eye, moderate stage: Secondary | ICD-10-CM | POA: Diagnosis not present

## 2020-05-14 ENCOUNTER — Inpatient Hospital Stay: Admission: RE | Admit: 2020-05-14 | Payer: BC Managed Care – PPO | Source: Ambulatory Visit

## 2020-05-20 ENCOUNTER — Other Ambulatory Visit: Payer: Self-pay

## 2020-05-20 ENCOUNTER — Ambulatory Visit
Admission: RE | Admit: 2020-05-20 | Discharge: 2020-05-20 | Disposition: A | Discharge status: Home / Self Care | Payer: BC Managed Care – PPO | Source: Ambulatory Visit | Attending: Family Medicine | Admitting: Family Medicine

## 2020-05-20 DIAGNOSIS — Z1231 Encounter for screening mammogram for malignant neoplasm of breast: Secondary | ICD-10-CM | POA: Diagnosis not present

## 2020-05-24 DIAGNOSIS — R0981 Nasal congestion: Secondary | ICD-10-CM | POA: Diagnosis not present

## 2020-05-24 DIAGNOSIS — R519 Headache, unspecified: Secondary | ICD-10-CM | POA: Diagnosis not present

## 2020-07-08 DIAGNOSIS — E1169 Type 2 diabetes mellitus with other specified complication: Secondary | ICD-10-CM | POA: Diagnosis not present

## 2020-07-08 DIAGNOSIS — Z8639 Personal history of other endocrine, nutritional and metabolic disease: Secondary | ICD-10-CM | POA: Diagnosis not present

## 2020-07-08 DIAGNOSIS — E7800 Pure hypercholesterolemia, unspecified: Secondary | ICD-10-CM | POA: Diagnosis not present

## 2020-07-08 DIAGNOSIS — N183 Chronic kidney disease, stage 3 unspecified: Secondary | ICD-10-CM | POA: Diagnosis not present

## 2020-07-08 DIAGNOSIS — J309 Allergic rhinitis, unspecified: Secondary | ICD-10-CM | POA: Diagnosis not present

## 2020-07-08 DIAGNOSIS — M5430 Sciatica, unspecified side: Secondary | ICD-10-CM | POA: Diagnosis not present

## 2020-07-08 DIAGNOSIS — Z0000 Encounter for general adult medical examination without abnormal findings: Secondary | ICD-10-CM | POA: Diagnosis not present

## 2020-07-08 DIAGNOSIS — I10 Essential (primary) hypertension: Secondary | ICD-10-CM | POA: Diagnosis not present

## 2020-07-15 DIAGNOSIS — R059 Cough, unspecified: Secondary | ICD-10-CM | POA: Diagnosis not present

## 2020-07-15 DIAGNOSIS — U071 COVID-19: Secondary | ICD-10-CM | POA: Diagnosis not present

## 2020-07-20 ENCOUNTER — Ambulatory Visit: Payer: Medicare Other | Admitting: Neurology

## 2020-07-21 ENCOUNTER — Other Ambulatory Visit: Payer: Self-pay | Admitting: Family Medicine

## 2020-07-21 DIAGNOSIS — E2839 Other primary ovarian failure: Secondary | ICD-10-CM

## 2020-07-26 ENCOUNTER — Other Ambulatory Visit: Payer: BC Managed Care – PPO

## 2020-09-21 DIAGNOSIS — H401122 Primary open-angle glaucoma, left eye, moderate stage: Secondary | ICD-10-CM | POA: Diagnosis not present

## 2020-10-01 ENCOUNTER — Encounter: Payer: Self-pay | Admitting: *Deleted

## 2020-10-04 ENCOUNTER — Other Ambulatory Visit: Payer: Self-pay

## 2020-10-04 ENCOUNTER — Encounter: Payer: Self-pay | Admitting: Neurology

## 2020-10-04 ENCOUNTER — Ambulatory Visit: Payer: Medicare Other | Admitting: Neurology

## 2020-10-04 VITALS — BP 139/63 | HR 64 | Ht 60.0 in | Wt 182.6 lb

## 2020-10-04 DIAGNOSIS — G4486 Cervicogenic headache: Secondary | ICD-10-CM | POA: Diagnosis not present

## 2020-10-04 DIAGNOSIS — G479 Sleep disorder, unspecified: Secondary | ICD-10-CM

## 2020-10-04 DIAGNOSIS — R519 Headache, unspecified: Secondary | ICD-10-CM | POA: Diagnosis not present

## 2020-10-04 DIAGNOSIS — R351 Nocturia: Secondary | ICD-10-CM

## 2020-10-04 MED ORDER — CYCLOBENZAPRINE HCL 10 MG PO TABS: 10.0000 mg | ORAL_TABLET | Freq: Every day | ORAL | 3 refills | Status: DC

## 2020-10-04 NOTE — Progress Notes (Addendum)
Subjective:    Patient ID: [name] is a 70 y.o. female.  HPI    [name] [name] [address] [place],  [address]  Dear [name],   I saw your patient, [name], upon your kind request in the neurologic clinic today for consultation of headaches.  The patient is unaccompanied today.  As you know, [name] is a 70 year old right-handed woman with an underlying medical history of hypertension, hyperlipidemia, diabetes, neck pain, glaucoma, allergies, and obesity, who reports recurrent headaches for the past several years but in the past year, and her headache has been nearly daily.  She describes a pressure sensation above the right eyebrow area, closer to the midline, it feels like a sinus pressure.  She also has a separate headache that seems to start in the back of her head on the right side, and sometimes radiates forward.  She does not have any associated neurological accompaniments such as blurry vision, nausea or vomiting but does find the headache quite debilitating, it helps to lie down in a dark room and rest, she also finds essential oils including peppermint helpful as well as a arthritis cream that contains menthol.  She rubs it in the back of her neck and sometimes on the forehead.  She has used essential oils and a diffuser and heat packs in the neck as well as forehead which also helps.  She has been taking Tylenol on a nearly daily basis, up to 3 a day.  The most typical time for her headache is in the middle of the night or early morning and sometimes she can delay taking a Tylenol by pursuing these other supportive measures and takes a Tylenol later in the afternoon.  She tries to hydrate well with water, she limits her caffeine to 1 or 2 cups of coffee in the morning.  She quit smoking in 1981.  She was treated as a teenager for migraines but those headaches were somewhat different and migraine  prescription medicine did not help as far she recalls.  She does not sleep well, has significant sleep disruption, has nocturia about 3 times per average night but is not known to snore.  She is retired, lives with her husband and has a blended family, 2 biological children.  She denies any recent stress. I reviewed your office note from 05/24/2020.  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  He has seen ENT for recurrent sinusitis in the past and recurrent headaches.  She had a maxillofacial CT without contrast on 04/05/2015 and I reviewed the results: IMPRESSION: Negative paranasal sinuses. Symmetric nasal cavity mucosal thickening may indicate rhinitis.  Her eye exam is up-to-date, she did not need a new prescription, she is followed for glaucoma and has a history of cataracts which are monitored.  She sees [name].  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  She also had injections into the finger joints and trigger finger release.  She had injections into her neck for neck pain.  Over the course of time she has tried muscle relaxant and also narcotic pain medications, currently not on any muscle relaxant or narcotic pain medication.  She has seen rheumatology as well but was not diagnosed with a rheumatological type of arthritis.  She was told she had significant osteoarthritis.  She has had physical therapy.  She  has a right frozen shoulder.  She reports a longstanding history of sinus headaches and recurrent infections, has been treated with antibiotics in the past.  She has been seen by allergy specialist as well.  She is currently on generic Nasacort and Singulair.  Her Past Medical History Is Significant For: Past Medical History:  Diagnosis Date   Diabetes mellitus 03/13/2010   Glaucoma    Hyperlipidemia    Hypertension    Obesity     Her Past Surgical History Is  Significant For: Past Surgical History:  Procedure Laterality Date   ABDOMINAL HYSTERECTOMY  2004   APPENDECTOMY  2009   CESAREAN SECTION  1976, 1984   ROTATOR CUFF REPAIR  2004   THYROIDECTOMY, PARTIAL  2001   TOTAL KNEE ARTHROPLASTY  2007, 2008   TRIGGER FINGER RELEASE  2001    Her Family History Is Significant For: Family History  Problem Relation Age of Onset   Diabetes Mother    Heart disease Mother    Alcohol abuse Father     Her Social History Is Significant For: Social History   Socioeconomic History   Marital status: Married    Spouse name: Not on file   Number of children: Not on file   Years of education: Not on file   Highest education level: Not on file  Occupational History   Not on file  Tobacco Use   Smoking status: Former   Smokeless tobacco: Not on file  Substance and Sexual Activity   Alcohol use: No   Drug use: No   Sexual activity: Not on file  Other Topics Concern   Not on file  Social History Narrative   Caffeine 1 cup coffee daily. 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  Education: HS grad.  Live husband, children 2.    Social Determinants of Health   Financial Resource Strain: Not on file  Food Insecurity: Not on file  Transportation Needs: Not on file  Physical Activity: Not on file  Stress: Not on file  Social Connections: Not on file    Her Allergies Are:  No Known Allergies:   Her Current Medications Are:  Outpatient Encounter Medications as of 10/04/2020  Medication Sig   atorvastatin (LIPITOR) 40 MG tablet Take 40 mg by mouth daily.   carvedilol (COREG) 25 MG tablet Take 25 mg by mouth 2 (two) times daily with a meal.    chlorthalidone (HYGROTON) 25 MG tablet Take 12.5 mg by mouth daily.    fish oil-omega-3 fatty acids 1000 MG capsule Take 1,200 mg by mouth daily.   gabapentin (NEURONTIN) 300 MG capsule Take 300 mg by mouth daily.   Ginger, Zingiber officinalis, (GINGER ROOT PO) Take by mouth daily.   metFORMIN (GLUCOPHAGE)  500 MG tablet Take by mouth daily.   Multiple Vitamins-Minerals (MULTI FOR HER 50+ PO) Take 1 tablet by mouth daily.   TURMERIC PO Take by mouth.   [DISCONTINUED] albuterol (PROVENTIL HFA;VENTOLIN HFA) 108 (90 BASE) MCG/ACT inhaler Inhale 2 puffs into the lungs every 6 (six) hours as needed for wheezing or shortness of breath.   [DISCONTINUED] amLODipine-benazepril (LOTREL) 10-20 MG per capsule Take 1 capsule by mouth daily.   [DISCONTINUED] amoxicillin-clavulanate (AUGMENTIN) 875-125 MG tablet Take 1 tablet by mouth every 6 (six) months.   [DISCONTINUED] calcium carbonate (OS-CAL) 600 MG TABS Take 600 mg by mouth daily.   [DISCONTINUED] Misc Natural Products (TART CHERRY ADVANCED PO) Take by mouth.   [DISCONTINUED] Nutritional Supplements (ESTROVEN PO) Take 1  tablet by mouth daily.   No facility-administered encounter medications on file as of 10/04/2020.  :   Review of Systems:  Out of a complete 14 point review of systems, all are reviewed and negative with the exception of these symptoms as listed below:  Review of Systems  Neurological:        Migraines, headaches.   Objective:  Neurological Exam  Physical Exam Physical Examination:   Vitals:   10/04/20 0732  BP: 139/63  Pulse: 64    General Examination: The patient is a very pleasant 70 y.o. female in no acute distress. She appears well-developed and well-nourished and well groomed.   HEENT: Normocephalic, atraumatic, pupils are equal, round and reactive to light and accommodation. Funduscopic exam is slightly difficult due to bilateral mild cataracts noted.  No obvious abnormalities in the funduscopic exam.  Extraocular tracking is good without limitation to gaze excursion or nystagmus noted. Normal smooth pursuit is noted. Hearing is grossly intact. Face is symmetric with normal facial animation and normal facial sensation. Speech is clear with no dysarthria noted. There is no hypophonia. There is no lip, neck/head, jaw or  voice tremor. Neck is supple with full range of passive and active motion. There are no carotid bruits on auscultation. Oropharynx exam reveals: moderate mouth dryness, adequate dental hygiene and moderate to significant airway crowding secondary to thicker soft palate and small airway entry, wider tongue.  Tonsils and uvula not fully visualized, Mallampati class IV.  Tongue protrudes centrally.    Chest: Clear to auscultation without wheezing, rhonchi or crackles noted.  Heart: S1+S2+0, regular and normal without murmurs, rubs or gallops noted.   Abdomen: Soft, non-tender and non-distended with normal bowel sounds appreciated on auscultation.  Extremities: There is no pitting edema in the distal lower extremities bilaterally. Pedal pulses are intact.  Skin: Warm and dry without trophic changes noted.  Musculoskeletal: exam reveals no obvious joint deformities, with the exception of mild arthritic changes in her hands, decreased range of motion right shoulder.    Neurologically:  Mental status: The patient is awake, alert and oriented in all 4 spheres. Her immediate and remote memory, attention, language skills and fund of knowledge are appropriate. There is no evidence of aphasia, agnosia, apraxia or anomia. Speech is clear with normal prosody and enunciation. Thought process is linear. Mood is normal and affect is normal.  Cranial nerves II - XII are as described above under HEENT exam. In addition: shoulder shrug is normal with equal shoulder height noted. Motor exam: Normal bulk, strength and tone is noted. There is no drift, tremor or rebound. Romberg is negative. Reflexes are 1+ in the upper extremities, absent in both knees, trace in the ankles bilaterally. Babinski: Toes are flexor bilaterally. Fine motor skills and coordination: intact with normal finger taps, normal hand movements, normal rapid alternating patting, normal foot taps and normal foot agility.  Cerebellar testing: No  dysmetria or intention tremor on finger to nose testing. Heel to shin is unremarkable bilaterally. There is no truncal or gait ataxia.  Sensory exam: intact to light touch, vibration, temperature sense in the upper and lower extremities.  Gait, station and balance: She stands easily. No veering to one side is noted. No leaning to one side is noted. Posture is age-appropriate and stance is narrow based. Gait shows normal stride length and normal pace. No problems turning are noted. Tandem walk is unremarkable.   Assessment and Plan:   In summary, [name] is a very pleasant  70 y.o.-year old female with an underlying medical history of hypertension, hyperlipidemia, diabetes, neck pain, glaucoma, allergies, and obesity, who presents for evaluation of her recurrent headaches of several years duration but more consistently with nearly daily headaches particularly nocturnal and morning headaches for the past year.  Her examination is nonfocal which is reassuring, history not telltale for migraine headaches.  I discussed this with her in detail today.  Most likely, she has a combination of headaches, including cervicogenic headaches as you mentioned, also recurrent sinus type headaches.  She may also be at risk for sleep apnea.  She has never had a sleep study and would be willing to get tested for this.  I would like to pursue a brain scan as well as she has never had a brain MRI and I would like to exclude a structural cause of her symptoms especially since she has very specific right frontal pressure type pain.  She is advised to consider treatment for sleep apnea should her sleep test revealed underlying obstructive sleep apnea.  I explained this to her as well today.  She is agreeable to this approach, for symptomatic treatment currently I would recommend a trial of low-dose Flexeril at night.  Of note, she does take Tylenol daily and she is also on gabapentin at night for low back pain.  She is advised to  be mindful of the sedating effects of Flexeril.  She has tried it in the past for neck or back pain.  She is agreeable to starting Flexeril as needed.  We will keep her posted as to her test results by phone call.  We will plan a follow-up after testing.  I answered all her questions today and she was in agreement with the plan.   Thank you very much for allowing me to participate in the care of this nice patient. If I can be of any further assistance to you please do not hesitate to call me at [phone].  Sincerely,   [name]  10/05/2020: I will change her brain MRI order from with and without contrast to without contrast only given her elevated creatinine level.

## 2020-10-04 NOTE — Patient Instructions (Addendum)
It was nice to meet you today.   As discussed, your headaches are probably a combination of issues including neck pain related headaches, sinus and allergy related headaches, and headaches that may be related to an underlying sleep disturbance including sleep apnea possibility.  I would like to proceed with a sleep study to rule out sleep apnea.  Your exam is benign which is reassuring.  Nevertheless, I would like to go ahead with a brain scan, called MRI and call you with the test results. We will have to schedule you for this on a separate date. This test requires authorization from your insurance, and we will take care of the insurance process.  We will keep you posted as to your test results and consider treatment for sleep apnea if the need arises.  We will start you on Flexeril low-dose to see if a muscle relaxer may help you reduce the headaches and save on taking Tylenol on a daily basis. Side effects of Flexeril may include dizziness, sleepiness, mouth dryness, feeling off balance, so please be mindful. You may not be able to drive after taking it.   We will plan a follow-up after testing.

## 2020-10-05 LAB — COMPREHENSIVE METABOLIC PANEL
ALT: 15 IU/L (ref 0–32)
AST: 15 IU/L (ref 0–40)
Albumin/Globulin Ratio: 2 (ref 1.2–2.2)
Albumin: 4.7 g/dL (ref 3.8–4.8)
Alkaline Phosphatase: 66 IU/L (ref 44–121)
BUN/Creatinine Ratio: 23 (ref 12–28)
BUN: 32 mg/dL — ABNORMAL HIGH (ref 8–27)
Bilirubin Total: 0.4 mg/dL (ref 0.0–1.2)
CO2: 23 mmol/L (ref 20–29)
Calcium: 10.8 mg/dL — ABNORMAL HIGH (ref 8.7–10.3)
Chloride: 109 mmol/L — ABNORMAL HIGH (ref 96–106)
Creatinine, Ser: 1.41 mg/dL — ABNORMAL HIGH (ref 0.57–1.00)
Globulin, Total: 2.3 g/dL (ref 1.5–4.5)
Glucose: 150 mg/dL — ABNORMAL HIGH (ref 65–99)
Potassium: 4.7 mmol/L (ref 3.5–5.2)
Sodium: 146 mmol/L — ABNORMAL HIGH (ref 134–144)
Total Protein: 7 g/dL (ref 6.0–8.5)
eGFR: 41 mL/min/{1.73_m2} — ABNORMAL LOW (ref >59)

## 2020-10-05 NOTE — Addendum Note (Signed)
Addended by: Star Age on: 10/05/2020 01:31 PM   Modules accepted: Orders

## 2020-10-06 ENCOUNTER — Telehealth: Payer: Self-pay | Admitting: *Deleted

## 2020-10-06 ENCOUNTER — Ambulatory Visit
Admission: RE | Admit: 2020-10-06 | Discharge: 2020-10-06 | Disposition: A | Discharge status: Home / Self Care | Payer: Medicare Other | Source: Ambulatory Visit | Attending: Neurology | Admitting: Neurology

## 2020-10-06 DIAGNOSIS — G4486 Cervicogenic headache: Secondary | ICD-10-CM

## 2020-10-06 DIAGNOSIS — R519 Headache, unspecified: Secondary | ICD-10-CM

## 2020-10-06 DIAGNOSIS — G479 Sleep disorder, unspecified: Secondary | ICD-10-CM

## 2020-10-06 DIAGNOSIS — R351 Nocturia: Secondary | ICD-10-CM

## 2020-10-06 MED ORDER — GADOBENATE DIMEGLUMINE 529 MG/ML IV SOLN
15.0000 mL | Freq: Once | INTRAVENOUS | Status: AC | PRN
  Administered 2020-10-06: 15 mL via INTRAVENOUS

## 2020-10-06 NOTE — Telephone Encounter (Signed)
Relayed results to pt.  She is hydrating well as she had the MRI w/wo contrast.  Will forward result to Dr. Jonna Coup FM.  She verbalized understanding.

## 2020-10-06 NOTE — Telephone Encounter (Signed)
-----   Message from Star Age, MD sent at 10/05/2020  1:33 PM EDT ----- Please call patient and advise her that her blood chemistry panel/blood work from yesterday indicated mild kidney function impairment.  I would not recommend we proceed with a brain MRI with contrast and only do it without contrast.  I have changed the order to reflect brain MRI without contrast only.  Please advise her that she could be dehydrated which could be an explanation for her elevated creatinine and BUN.  Also her blood sugar was elevated at 150 which is a little bit higher than we should expect for a random blood sugar test.  Sodium was slightly elevated and her chloride level was slightly elevated as well.  Please encourage her to stay really well-hydrated with water and follow-up with her primary care physician as scheduled.  She may need a recheck on her kidney function in the next 2 or 3 months.  Again, I do not think she is fully safe to take MRI contrast which is why I changed the order.  It looks like she is scheduled for tomorrow.

## 2020-10-12 ENCOUNTER — Telehealth: Payer: Self-pay | Admitting: *Deleted

## 2020-10-12 NOTE — Telephone Encounter (Signed)
-----   Message from Star Age, MD sent at 10/08/2020 11:03 AM EDT ----- Please call and advise the patient that the recent scan we did was within normal limits. We did a brain MRI with and wo contrast, which showed normal for age findings. In particular, there were no acute findings, such as a stroke, or mass or blood products. No further action is required on this test at this time. Please remind patient to keep any upcoming appointments or tests and to call us with any interim questions, concerns, problems or updates. We will proceed with sleep testing as planned.   Thanks,  Star Age, MD, PhD

## 2020-10-12 NOTE — Telephone Encounter (Signed)
LMVM for pt that MRI results normal findings for age. NO acute findings.  She may call if questions.  Proceed with sleep study.

## 2020-12-01 ENCOUNTER — Ambulatory Visit (INDEPENDENT_AMBULATORY_CARE_PROVIDER_SITE_OTHER): Payer: Medicare Other | Admitting: Neurology

## 2020-12-01 ENCOUNTER — Other Ambulatory Visit: Payer: Self-pay

## 2020-12-01 DIAGNOSIS — G4733 Obstructive sleep apnea (adult) (pediatric): Secondary | ICD-10-CM

## 2020-12-01 DIAGNOSIS — R351 Nocturia: Secondary | ICD-10-CM

## 2020-12-01 DIAGNOSIS — R519 Headache, unspecified: Secondary | ICD-10-CM

## 2020-12-01 DIAGNOSIS — G4486 Cervicogenic headache: Secondary | ICD-10-CM

## 2020-12-01 DIAGNOSIS — G4720 Circadian rhythm sleep disorder, unspecified type: Secondary | ICD-10-CM

## 2020-12-01 DIAGNOSIS — G479 Sleep disorder, unspecified: Secondary | ICD-10-CM

## 2020-12-09 ENCOUNTER — Telehealth: Payer: Self-pay | Admitting: Neurology

## 2020-12-09 NOTE — Telephone Encounter (Signed)
-----   Message from Star Age, MD sent at 12/09/2020  1:29 PM EDT ----- Patient referred by Dr. Harrington Challenger for recurrent HAs, seen by me on 10/04/20, diagnostic PSG on 12/01/20.    Please call and notify the patient that the recent sleep study showed mild OSA, but severe sleep apnea with severe desaturations during REM/dream sleep. She did not sleep very well, did not achieve any sleep on her back and little REM sleep, which could potentially underestimate her "true" degree of sleep apnea. Given the patient's medical history and sleep related complaints, I recommend treatment with an autoPAP, which means, that we don't have to bring her back for a second sleep study with CPAP, but will let him try an autoPAP machine at home, through a DME company (of her choice, or as per insurance requirement). The DME representative will educate her on how to use the machine, how to put the mask on, etc. I have placed an order in the chart. Please send referral, talk to patient, send report to referring MD. We will need a FU in sleep clinic for 10 weeks post-PAP set up, please arrange that with me or one of our NPs. Thanks,   Star Age, MD, PhD [facility])

## 2020-12-09 NOTE — Addendum Note (Signed)
Addended by: Star Age on: 12/09/2020 01:29 PM   Modules accepted: Orders

## 2020-12-09 NOTE — Telephone Encounter (Signed)
I called pt. I advised pt that Dr. Rexene Alberts reviewed their sleep study results and found that pt has mild sleep apnea. Dr. Rexene Alberts recommends that pt starts auto CPAP. I reviewed PAP compliance expectations with the pt. Pt is agreeable to starting a CPAP. I advised pt that an order will be sent to a DME, [facility], and [facility] will call the pt within about one week after they file with the pt's insurance. [facility] will show the pt how to use the machine, fit for masks, and troubleshoot the CPAP if needed. A follow up appt was made for insurance purposes with Dr. Rexene Alberts on Jan 11,2023 at 8:45 am. Pt verbalized understanding to arrive 15 minutes early and bring their CPAP. A letter with all of this information in it will be mailed to the pt as a reminder. I verified with the pt that the address we have on file is correct. Pt verbalized understanding of results. Pt had no questions at this time but was encouraged to call back if questions arise. I have sent the order to [facility] and have received confirmation that they have received the order.

## 2020-12-09 NOTE — Procedures (Signed)
PATIENT'S NAME:  Lovinia, [name] DOB:      [birth year]      MR#:    [id number]     DATE OF RECORDING: 12/01/2020 REFERRING M.D.:  Lona Kettle, MD Study Performed:   Baseline Polysomnogram HISTORY: 70 year old woman with a history of hypertension, hyperlipidemia, diabetes, neck pain, glaucoma, allergies, and obesity, who reports recurrent headaches. She does not sleep well, has significant sleep disruption, has nocturia about 3 times per average night. The patient's weight 182 pounds with a height of 60 (inches), resulting in a BMI of 35.9 kg/m2.   CURRENT MEDICATIONS: Lipitor, Coreg, Hygroton, Fish Oil, Neurontin, Ginger Root, Glucophage, Multiple Vitamins   PROCEDURE:  This is a multichannel digital polysomnogram utilizing the Somnostar 11.2 system.  Electrodes and sensors were applied and monitored per AASM Specifications.   EEG, EOG, Chin and Limb EMG, were sampled at 200 Hz.  ECG, Snore and Nasal Pressure, Thermal Airflow, Respiratory Effort, CPAP Flow and Pressure, Oximetry was sampled at 50 Hz. Digital video and audio were recorded.      BASELINE STUDY  Lights Out was at 21:52 and Lights On at 05:15.  Total recording time (TRT) was 442 minutes, with a total sleep time (TST) of 222 minutes.   The patient's sleep latency was 108 minutes, which is delayed. REM latency was 331.5 minutes, which is markedly delayed.  The sleep efficiency was 50.2 %, which is reduced significantly.     SLEEP ARCHITECTURE: WASO (Wake after sleep onset) was 130 minutes with moderate sleep fragmentation noted.  There were 29 minutes in Stage N1, 160 minutes Stage N2, 13.5 minutes Stage N3 and 19.5 minutes in Stage REM.  The percentage of Stage N1 was 13.1%, which is increased, Stage N2 was 72.1%, which is increased, Stage N3 was 6.1% and Stage R (REM sleep) was 8.8%, which is reduced. The arousals were noted as: 35 were spontaneous, 0 were associated with PLMs, 2 were associated with respiratory events.  RESPIRATORY  ANALYSIS:  There were a total of 21 respiratory events:  11 obstructive apneas, 0 central apneas and 0 mixed apneas with a total of 11 apneas and an apnea index (AI) of 3. /hour. There were 10 hypopneas with a hypopnea index of 2.7 /hour. The patient also had 0 respiratory event related arousals (RERAs).      The total APNEA/HYPOPNEA INDEX (AHI) was 5.7/hour and the total RESPIRATORY DISTURBANCE INDEX was  5.7 /hour.  18 events occurred in REM sleep and 6 events in NREM. The REM AHI was  55.4 /hour, versus a non-REM AHI of .9. The patient spent 0 minutes of total sleep time in the supine position and 222 minutes in non-supine.. The supine AHI was n/a versus a non-supine AHI of 5.7.  OXYGEN SATURATION & C02:  The Wake baseline 02 saturation was 94%, with the lowest being 69%. Time spent below 89% saturation equaled 119 minutes.  PERIODIC LIMB MOVEMENTS: The patient had a total of 8 Periodic Limb Movements.  The Periodic Limb Movement (PLM) index was 2.2 and the PLM Arousal index was 0/hour.  Audio and video analysis did not show any abnormal or unusual movements, behaviors, phonations or vocalizations. The patient took 1 bathroom break. Moderate snoring was noted. The EKG was in keeping with normal sinus rhythm (NSR).  Post-study, the patient indicated that sleep was better than usual.   IMPRESSION: Obstructive Sleep Apnea (OSA) Dysfunctions associated with sleep stages or arousal from sleep  RECOMMENDATIONS: This study demonstrates overall mild obstructive sleep  apnea, but evidence of severe in REM sleep related OSA, with a total AHI of 5.7/hour, REM AHI of 55.4/hour, and O2 nadir of 69% during (non-supine) REM sleep. The absence of supine sleep, poor sleep consolidation and reduced amount of REM sleep likely underestimates her sleep disordered breathing. Given the patient's medical history and sleep related complaints, treatment with positive airway pressure is recommended; this can be achieved in  the form of autoPAP. Alternatively, a full-night CPAP titration study would allow optimization of therapy if needed. Other treatment options may include avoidance of supine sleep position along with weight loss, upper airway or jaw surgery in selected patients or the use of an oral appliance in certain patients. ENT evaluation and/or consultation with a maxillofacial surgeon or dentist may be feasible in some instances.    Please note that untreated obstructive sleep apnea may carry additional perioperative morbidity. Patients with significant obstructive sleep apnea should receive perioperative PAP therapy and the surgeons and particularly the anesthesiologist should be informed of the diagnosis and the severity of the sleep disordered breathing. This study shows sleep fragmentation and abnormal sleep stage percentages; these are nonspecific findings and per se do not signify an intrinsic sleep disorder or a cause for the patient's sleep-related symptoms. Causes include (but are not limited to) the first night effect of the sleep study, circadian rhythm disturbances, medication effect or an underlying mood disorder or medical problem.  The patient should be cautioned not to drive, work at heights, or operate dangerous or heavy equipment when tired or sleepy. Review and reiteration of good sleep hygiene measures should be pursued with any patient. The patient will be seen in follow-up by Dr. Rexene Alberts at [facility] for discussion of the test results and further management strategies. The referring provider will be notified of the test results.  I certify that I have reviewed the entire raw data recording prior to the issuance of this report in accordance with the Standards of Accreditation of the American Academy of Sleep Medicine (AASM)  Star Age, MD, PhD Diplomat, American Board of Neurology and Sleep Medicine (Neurology and Sleep Medicine)

## 2020-12-21 NOTE — Telephone Encounter (Signed)
Pt called states she has not heard anything from the DME.

## 2020-12-21 NOTE — Telephone Encounter (Addendum)
Sent secure message to [facility] to follow-up. Pt should also reach out to them.   Called pt and let her know I had reached out to [facility] to confirm they had received the order. Also advised pt to call them and provided the phone number. She verbalized appreciation for the call.

## 2020-12-24 DIAGNOSIS — Z20822 Contact with and (suspected) exposure to covid-19: Secondary | ICD-10-CM | POA: Diagnosis not present

## 2020-12-24 DIAGNOSIS — R509 Fever, unspecified: Secondary | ICD-10-CM | POA: Diagnosis not present

## 2020-12-24 DIAGNOSIS — M25511 Pain in right shoulder: Secondary | ICD-10-CM | POA: Diagnosis not present

## 2020-12-27 ENCOUNTER — Encounter: Payer: Self-pay | Admitting: Neurology

## 2020-12-31 ENCOUNTER — Ambulatory Visit
Admission: RE | Admit: 2020-12-31 | Discharge: 2020-12-31 | Disposition: A | Discharge status: Home / Self Care | Payer: Medicare Other | Source: Ambulatory Visit | Attending: Family Medicine | Admitting: Family Medicine

## 2020-12-31 ENCOUNTER — Other Ambulatory Visit: Payer: Self-pay

## 2020-12-31 DIAGNOSIS — Z780 Asymptomatic menopausal state: Secondary | ICD-10-CM | POA: Diagnosis not present

## 2020-12-31 DIAGNOSIS — M8589 Other specified disorders of bone density and structure, multiple sites: Secondary | ICD-10-CM | POA: Diagnosis not present

## 2020-12-31 DIAGNOSIS — E2839 Other primary ovarian failure: Secondary | ICD-10-CM

## 2021-01-13 DIAGNOSIS — E1169 Type 2 diabetes mellitus with other specified complication: Secondary | ICD-10-CM | POA: Diagnosis not present

## 2021-01-13 DIAGNOSIS — N289 Disorder of kidney and ureter, unspecified: Secondary | ICD-10-CM | POA: Diagnosis not present

## 2021-01-28 ENCOUNTER — Other Ambulatory Visit: Payer: Self-pay | Admitting: Neurology

## 2021-01-31 ENCOUNTER — Telehealth: Payer: Self-pay | Admitting: Neurology

## 2021-01-31 NOTE — Telephone Encounter (Signed)
Pt called requesting refill for cyclobenzaprine (FLEXERIL) 10 MG tablet until her CPAP machine arrives. [facility] [place]

## 2021-01-31 NOTE — Telephone Encounter (Signed)
Refill has been sent.  °

## 2021-02-21 DIAGNOSIS — H401111 Primary open-angle glaucoma, right eye, mild stage: Secondary | ICD-10-CM | POA: Diagnosis not present

## 2021-03-09 DIAGNOSIS — G4733 Obstructive sleep apnea (adult) (pediatric): Secondary | ICD-10-CM | POA: Diagnosis not present

## 2021-03-23 ENCOUNTER — Ambulatory Visit: Payer: Medicare Other | Admitting: Neurology

## 2021-04-06 ENCOUNTER — Other Ambulatory Visit: Payer: Self-pay | Admitting: Family Medicine

## 2021-04-06 DIAGNOSIS — Z1231 Encounter for screening mammogram for malignant neoplasm of breast: Secondary | ICD-10-CM

## 2021-04-09 DIAGNOSIS — G4733 Obstructive sleep apnea (adult) (pediatric): Secondary | ICD-10-CM | POA: Diagnosis not present

## 2021-05-10 DIAGNOSIS — G4733 Obstructive sleep apnea (adult) (pediatric): Secondary | ICD-10-CM | POA: Diagnosis not present

## 2021-05-17 ENCOUNTER — Encounter: Payer: Self-pay | Admitting: Neurology

## 2021-05-17 ENCOUNTER — Ambulatory Visit: Payer: Medicare Other | Admitting: Neurology

## 2021-05-17 VITALS — BP 128/69 | HR 65 | Ht 60.0 in | Wt 190.6 lb

## 2021-05-17 DIAGNOSIS — G4733 Obstructive sleep apnea (adult) (pediatric): Secondary | ICD-10-CM | POA: Diagnosis not present

## 2021-05-17 DIAGNOSIS — R519 Headache, unspecified: Secondary | ICD-10-CM

## 2021-05-17 DIAGNOSIS — Z9989 Dependence on other enabling machines and devices: Secondary | ICD-10-CM

## 2021-05-17 DIAGNOSIS — G4486 Cervicogenic headache: Secondary | ICD-10-CM | POA: Diagnosis not present

## 2021-05-17 NOTE — Patient Instructions (Addendum)
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  Please avoid taking Tylenol every day and try to taper off of it gradually.  Avoid taking Flexeril every day as well, try to take it no more than 3 times a week.  Please make an appointment with your primary care physician to discuss referrals to ENT, orthopedics, and rheumatology. ? ?Please continue using your autoPAP regularly. While your insurance requires that you use PAP at least 4 hours each night on 70% of the nights, I recommend, that you not skip any nights and use it throughout the night if you can. Getting used to PAP and staying with the treatment long term does take time and patience and discipline. Untreated obstructive sleep apnea when it is moderate to severe can have an adverse impact on cardiovascular health and raise her risk for heart disease, arrhythmias, hypertension, congestive heart failure, stroke and diabetes. Untreated obstructive sleep apnea causes sleep disruption, nonrestorative sleep, and sleep deprivation. This can have an impact on your day to day functioning and cause daytime sleepiness and impairment of cognitive function, memory loss, mood disturbance, and problems focussing. Using PAP regularly can improve these symptoms. ? ?I am glad to hear that your autoPAP is going well. You are fully compliant with treatment and your numbers look good.  ?Keep up the good work! Please see one of our nurse practitioners in 6 months for sleep apnea and headache check up, and if you continue to do well on CPAP I we can see you once a year thereafter.  ? ? ?
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
## 2021-05-19 ENCOUNTER — Ambulatory Visit: Payer: Medicare Other | Admitting: Neurology

## 2021-05-23 ENCOUNTER — Ambulatory Visit
Admission: RE | Admit: 2021-05-23 | Discharge: 2021-05-23 | Disposition: A | Discharge status: Home / Self Care | Payer: Medicare Other | Source: Ambulatory Visit | Attending: Family Medicine | Admitting: Family Medicine

## 2021-05-23 DIAGNOSIS — Z1231 Encounter for screening mammogram for malignant neoplasm of breast: Secondary | ICD-10-CM

## 2021-05-24 ENCOUNTER — Other Ambulatory Visit: Payer: Self-pay | Admitting: Family Medicine

## 2021-05-24 DIAGNOSIS — R928 Other abnormal and inconclusive findings on diagnostic imaging of breast: Secondary | ICD-10-CM

## 2021-05-30 DIAGNOSIS — Z20822 Contact with and (suspected) exposure to covid-19: Secondary | ICD-10-CM | POA: Diagnosis not present

## 2021-05-30 DIAGNOSIS — R059 Cough, unspecified: Secondary | ICD-10-CM | POA: Diagnosis not present

## 2021-05-30 DIAGNOSIS — J069 Acute upper respiratory infection, unspecified: Secondary | ICD-10-CM | POA: Diagnosis not present

## 2021-06-03 ENCOUNTER — Ambulatory Visit
Admission: RE | Admit: 2021-06-03 | Discharge: 2021-06-03 | Disposition: A | Discharge status: Home / Self Care | Payer: Medicare Other | Source: Ambulatory Visit | Attending: Family Medicine | Admitting: Family Medicine

## 2021-06-03 DIAGNOSIS — R921 Mammographic calcification found on diagnostic imaging of breast: Secondary | ICD-10-CM | POA: Diagnosis not present

## 2021-06-03 DIAGNOSIS — R922 Inconclusive mammogram: Secondary | ICD-10-CM | POA: Diagnosis not present

## 2021-06-03 DIAGNOSIS — R928 Other abnormal and inconclusive findings on diagnostic imaging of breast: Secondary | ICD-10-CM | POA: Diagnosis not present

## 2021-06-07 DIAGNOSIS — G4733 Obstructive sleep apnea (adult) (pediatric): Secondary | ICD-10-CM | POA: Diagnosis not present

## 2021-06-16 DIAGNOSIS — G4733 Obstructive sleep apnea (adult) (pediatric): Secondary | ICD-10-CM | POA: Diagnosis not present

## 2021-07-08 DIAGNOSIS — G4733 Obstructive sleep apnea (adult) (pediatric): Secondary | ICD-10-CM | POA: Diagnosis not present

## 2021-07-20 DIAGNOSIS — E1169 Type 2 diabetes mellitus with other specified complication: Secondary | ICD-10-CM | POA: Diagnosis not present

## 2021-07-20 DIAGNOSIS — I10 Essential (primary) hypertension: Secondary | ICD-10-CM | POA: Diagnosis not present

## 2021-07-20 DIAGNOSIS — Z8639 Personal history of other endocrine, nutritional and metabolic disease: Secondary | ICD-10-CM | POA: Diagnosis not present

## 2021-07-20 DIAGNOSIS — E7800 Pure hypercholesterolemia, unspecified: Secondary | ICD-10-CM | POA: Diagnosis not present

## 2021-08-07 DIAGNOSIS — G4733 Obstructive sleep apnea (adult) (pediatric): Secondary | ICD-10-CM | POA: Diagnosis not present

## 2021-09-07 DIAGNOSIS — G4733 Obstructive sleep apnea (adult) (pediatric): Secondary | ICD-10-CM | POA: Diagnosis not present

## 2021-09-09 DIAGNOSIS — G4733 Obstructive sleep apnea (adult) (pediatric): Secondary | ICD-10-CM | POA: Diagnosis not present

## 2021-09-12 DIAGNOSIS — N1832 Chronic kidney disease, stage 3b: Secondary | ICD-10-CM | POA: Diagnosis not present

## 2021-09-12 DIAGNOSIS — Z0000 Encounter for general adult medical examination without abnormal findings: Secondary | ICD-10-CM | POA: Diagnosis not present

## 2021-09-12 DIAGNOSIS — J309 Allergic rhinitis, unspecified: Secondary | ICD-10-CM | POA: Diagnosis not present

## 2021-09-12 DIAGNOSIS — E1169 Type 2 diabetes mellitus with other specified complication: Secondary | ICD-10-CM | POA: Diagnosis not present

## 2021-09-12 DIAGNOSIS — M542 Cervicalgia: Secondary | ICD-10-CM | POA: Diagnosis not present

## 2021-09-12 DIAGNOSIS — I10 Essential (primary) hypertension: Secondary | ICD-10-CM | POA: Diagnosis not present

## 2021-09-12 DIAGNOSIS — E7800 Pure hypercholesterolemia, unspecified: Secondary | ICD-10-CM | POA: Diagnosis not present

## 2021-09-12 DIAGNOSIS — Z8639 Personal history of other endocrine, nutritional and metabolic disease: Secondary | ICD-10-CM | POA: Diagnosis not present

## 2021-10-06 DIAGNOSIS — G4733 Obstructive sleep apnea (adult) (pediatric): Secondary | ICD-10-CM | POA: Diagnosis not present

## 2021-10-07 DIAGNOSIS — G4733 Obstructive sleep apnea (adult) (pediatric): Secondary | ICD-10-CM | POA: Diagnosis not present

## 2021-10-21 DIAGNOSIS — H401111 Primary open-angle glaucoma, right eye, mild stage: Secondary | ICD-10-CM | POA: Diagnosis not present

## 2021-11-07 DIAGNOSIS — G4733 Obstructive sleep apnea (adult) (pediatric): Secondary | ICD-10-CM | POA: Diagnosis not present

## 2021-11-08 DIAGNOSIS — D126 Benign neoplasm of colon, unspecified: Secondary | ICD-10-CM | POA: Diagnosis not present

## 2021-11-08 DIAGNOSIS — K648 Other hemorrhoids: Secondary | ICD-10-CM | POA: Diagnosis not present

## 2021-11-08 DIAGNOSIS — Z86010 Personal history of colonic polyps: Secondary | ICD-10-CM | POA: Diagnosis not present

## 2021-11-08 DIAGNOSIS — D124 Benign neoplasm of descending colon: Secondary | ICD-10-CM | POA: Diagnosis not present

## 2021-11-08 DIAGNOSIS — Z09 Encounter for follow-up examination after completed treatment for conditions other than malignant neoplasm: Secondary | ICD-10-CM | POA: Diagnosis not present

## 2021-11-08 DIAGNOSIS — K621 Rectal polyp: Secondary | ICD-10-CM | POA: Diagnosis not present

## 2021-11-08 DIAGNOSIS — D123 Benign neoplasm of transverse colon: Secondary | ICD-10-CM | POA: Diagnosis not present

## 2021-11-11 DIAGNOSIS — K621 Rectal polyp: Secondary | ICD-10-CM | POA: Diagnosis not present

## 2021-11-11 DIAGNOSIS — D123 Benign neoplasm of transverse colon: Secondary | ICD-10-CM | POA: Diagnosis not present

## 2021-11-11 DIAGNOSIS — D124 Benign neoplasm of descending colon: Secondary | ICD-10-CM | POA: Diagnosis not present
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
## 2021-11-21 ENCOUNTER — Ambulatory Visit: Payer: Medicare Other | Admitting: Adult Health

## 2021-11-21 ENCOUNTER — Encounter: Payer: Self-pay | Admitting: Adult Health

## 2021-11-21 ENCOUNTER — Ambulatory Visit: Payer: Medicare Other | Admitting: Family Medicine

## 2021-11-21 VITALS — BP 143/59 | HR 62 | Ht 60.0 in | Wt 193.2 lb

## 2021-11-21 DIAGNOSIS — R519 Headache, unspecified: Secondary | ICD-10-CM

## 2021-11-21 DIAGNOSIS — Z9989 Dependence on other enabling machines and devices: Secondary | ICD-10-CM | POA: Diagnosis not present

## 2021-11-21 DIAGNOSIS — G4733 Obstructive sleep apnea (adult) (pediatric): Secondary | ICD-10-CM | POA: Diagnosis not present

## 2021-11-21 NOTE — Patient Instructions (Signed)
No changes at this time -continue nightly use of CPAP for adequate sleep apnea management    Follow-up in 1 year or call earlier if needed

## 2021-11-30 NOTE — Progress Notes (Signed)
-----   Message -----  From: Trinidad Curet, CMA  Sent: 11/21/2021   2:57 PM EDT  To: Leory Plowman New  Subject: CPAP Orders                                     New orders have been placed for the above pt, DOB: 1950-07-29  Thanks

## 2021-12-08 DIAGNOSIS — G4733 Obstructive sleep apnea (adult) (pediatric): Secondary | ICD-10-CM | POA: Diagnosis not present

## 2022-01-04 DIAGNOSIS — G4733 Obstructive sleep apnea (adult) (pediatric): Secondary | ICD-10-CM | POA: Diagnosis not present

## 2022-01-12 DIAGNOSIS — J069 Acute upper respiratory infection, unspecified: Secondary | ICD-10-CM | POA: Diagnosis not present

## 2022-01-12 DIAGNOSIS — R0981 Nasal congestion: Secondary | ICD-10-CM | POA: Diagnosis not present

## 2022-01-12 DIAGNOSIS — Z1152 Encounter for screening for COVID-19: Secondary | ICD-10-CM | POA: Diagnosis not present

## 2022-01-20 DIAGNOSIS — J208 Acute bronchitis due to other specified organisms: Secondary | ICD-10-CM | POA: Diagnosis not present

## 2022-02-28 DIAGNOSIS — H401111 Primary open-angle glaucoma, right eye, mild stage: Secondary | ICD-10-CM | POA: Diagnosis not present

## 2022-03-15 DIAGNOSIS — E1169 Type 2 diabetes mellitus with other specified complication: Secondary | ICD-10-CM | POA: Diagnosis not present

## 2022-03-15 DIAGNOSIS — M542 Cervicalgia: Secondary | ICD-10-CM | POA: Diagnosis not present

## 2022-03-15 DIAGNOSIS — N1832 Chronic kidney disease, stage 3b: Secondary | ICD-10-CM | POA: Diagnosis not present

## 2022-04-04 DIAGNOSIS — G4733 Obstructive sleep apnea (adult) (pediatric): Secondary | ICD-10-CM | POA: Diagnosis not present

## 2022-04-27 DIAGNOSIS — I129 Hypertensive chronic kidney disease with stage 1 through stage 4 chronic kidney disease, or unspecified chronic kidney disease: Secondary | ICD-10-CM | POA: Diagnosis not present

## 2022-04-27 DIAGNOSIS — N1832 Chronic kidney disease, stage 3b: Secondary | ICD-10-CM | POA: Diagnosis not present

## 2022-04-27 DIAGNOSIS — D472 Monoclonal gammopathy: Secondary | ICD-10-CM | POA: Diagnosis not present

## 2022-04-27 DIAGNOSIS — N390 Urinary tract infection, site not specified: Secondary | ICD-10-CM | POA: Diagnosis not present

## 2022-05-04 ENCOUNTER — Other Ambulatory Visit: Payer: Self-pay | Admitting: Nephrology

## 2022-05-04 DIAGNOSIS — N1832 Chronic kidney disease, stage 3b: Secondary | ICD-10-CM

## 2022-05-29 ENCOUNTER — Other Ambulatory Visit: Payer: Medicare Other

## 2022-06-02 ENCOUNTER — Telehealth: Payer: Self-pay | Admitting: Hematology and Oncology

## 2022-06-02 NOTE — Telephone Encounter (Signed)
scheduled per 3/21 referral, pt has been called and confirmed date and time. Pt is aware of location and to arrive early for check in

## 2022-06-20 ENCOUNTER — Ambulatory Visit
Admission: RE | Admit: 2022-06-20 | Discharge: 2022-06-20 | Disposition: A | Discharge status: Home / Self Care | Payer: Medicare Other | Source: Ambulatory Visit | Attending: Nephrology | Admitting: Nephrology

## 2022-06-20 DIAGNOSIS — N189 Chronic kidney disease, unspecified: Secondary | ICD-10-CM | POA: Diagnosis not present

## 2022-06-20 DIAGNOSIS — N1832 Chronic kidney disease, stage 3b: Secondary | ICD-10-CM

## 2022-06-20 DIAGNOSIS — N281 Cyst of kidney, acquired: Secondary | ICD-10-CM | POA: Diagnosis not present

## 2022-06-26 ENCOUNTER — Inpatient Hospital Stay: Payer: Medicare Other | Attending: Hematology and Oncology | Admitting: Hematology and Oncology

## 2022-06-26 ENCOUNTER — Other Ambulatory Visit: Payer: Self-pay | Admitting: Hematology and Oncology

## 2022-06-26 ENCOUNTER — Inpatient Hospital Stay: Payer: Medicare Other

## 2022-06-26 ENCOUNTER — Other Ambulatory Visit: Payer: Self-pay

## 2022-06-26 ENCOUNTER — Encounter: Payer: Self-pay | Admitting: Hematology and Oncology

## 2022-06-26 ENCOUNTER — Telehealth: Payer: Self-pay

## 2022-06-26 VITALS — BP 135/61 | HR 64 | Temp 100.0°F | Resp 18 | Ht 60.0 in | Wt 191.8 lb

## 2022-06-26 DIAGNOSIS — D472 Monoclonal gammopathy: Secondary | ICD-10-CM | POA: Diagnosis not present

## 2022-06-26 DIAGNOSIS — E1122 Type 2 diabetes mellitus with diabetic chronic kidney disease: Secondary | ICD-10-CM | POA: Diagnosis not present

## 2022-06-26 DIAGNOSIS — N183 Chronic kidney disease, stage 3 unspecified: Secondary | ICD-10-CM | POA: Insufficient documentation

## 2022-06-26 DIAGNOSIS — E119 Type 2 diabetes mellitus without complications: Secondary | ICD-10-CM | POA: Insufficient documentation

## 2022-06-26 NOTE — Telephone Encounter (Signed)
Called and left a message. Lab scheduled at 0930 at [facility] on 5/15, then go to [facility] radiology for bone survery at 10 am. Ask her to call the office with questions.

## 2022-06-26 NOTE — Assessment & Plan Note (Signed)
We discussed the natural history of MGUS The recently detectable M spike was low I do not believe her recent acute on chronic kidney failure is related to this I recommend additional workup with repeat blood work, 24-hour urine collection and skeletal survey for assessment I will see her next month for further follow-up

## 2022-06-26 NOTE — Progress Notes (Signed)
[facility] CONSULT NOTE  Patient Care Team: Daisy Floro, MD as PCP - General (Family Medicine)  ASSESSMENT & PLAN:  MGUS (monoclonal gammopathy of unknown significance) We discussed the natural history of MGUS The recently detectable M spike was low I do not believe her recent acute on chronic kidney failure is related to this I recommend additional workup with repeat blood work, 24-hour urine collection and skeletal survey for assessment I will see her next month for further follow-up  CKD (chronic kidney disease), stage III She had recent acute on chronic renal failure, could be related to her medication, uncontrolled diabetes and others We discussed importance of adequate hydration before she returns for her next visit  Type 2 diabetes mellitus treated without insulin We discussed importance of risk factor modification  Hypercalcemia Cause is unknown, could be exacerbated by dehydration We will repeat labs again next month for further follow-up  To rule out multiple myeloma, I recommend complete blood work, 24 hour urine collection for UPEP and skeletal survey to rule out multiple myeloma Depending on test results, we may or may not proceed with bone marrow aspirate and biopsy. Orders Placed This Encounter  Procedures   CMP (Cancer Center only)    Standing Status:   Future    Standing Expiration Date:   06/26/2023   CBC with Differential (Cancer Center Only)    Standing Status:   Future    Standing Expiration Date:   06/26/2023   Lactate dehydrogenase    Standing Status:   Future    Standing Expiration Date:   06/26/2023   Beta 2 microglobulin, serum    Standing Status:   Future    Standing Expiration Date:   06/26/2023   Kappa/Lambda Light Chains, Free, With Ratio, 24Hr. Urine    Standing Status:   Future    Standing Expiration Date:   06/26/2023   Multiple Myeloma Panel (SPEP&IFE w/QIG)    Standing Status:   Future    Standing Expiration Date:    06/26/2023   Kappa/lambda light chains    Standing Status:   Future    Standing Expiration Date:   06/26/2023   UPEP/TP, 24-Hr Urine    Standing Status:   Future    Standing Expiration Date:   06/26/2023    All questions were answered. The patient knows to call the clinic with any problems, questions or concerns. I spent 60 minutes counseling the patient face to face. The total time spent in the appointment was 60 minutes and more than 50% was on counseling.     Artis Delay, MD 06/26/22 2:12 PM  CHIEF COMPLAINTS/PURPOSE OF CONSULTATION:  MGUS  HISTORY OF PRESENTING ILLNESS:  [name] 72 y.o. 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  The M spike was 0.4 g, IgG level was borderline high.  Kappa/lambda light chain ratio was low She denies history of abnormal bone pain or bone fracture. Patient denies history of recurrent infection or atypical infections such as shingles of meningitis. Denies chills, night sweats, anorexia or abnormal weight loss.  MEDICAL HISTORY:  Past Medical History:  Diagnosis Date   Diabetes mellitus 03/13/2010   Glaucoma    Hyperlipidemia    Hypertension  Obesity     SURGICAL HISTORY: Past Surgical History:  Procedure Laterality Date   ABDOMINAL HYSTERECTOMY  2004   APPENDECTOMY  2009   CESAREAN SECTION  1976, 1984   ROTATOR CUFF REPAIR  2004   THYROIDECTOMY, PARTIAL  2001   TOTAL KNEE ARTHROPLASTY  2007, 2008   TRIGGER FINGER RELEASE  2001    SOCIAL HISTORY: Social History   Socioeconomic History   Marital status: Married    Spouse name: Not on file   Number of children: Not on file   Years of education: Not on file   Highest education level: Not on file   Occupational History   Not on file  Tobacco Use   Smoking status: Former   Smokeless tobacco: Not on file  Substance and Sexual Activity   Alcohol use: No   Drug use: No   Sexual activity: Not on file  Other Topics Concern   Not on file  Social History Narrative   Caffeine 1 cup coffee daily. 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  Education: HS grad.  Live husband, children 2.    Social Determinants of Health   Financial Resource Strain: Not on file  Food Insecurity: Not on file  Transportation Needs: Not on file  Physical Activity: Not on file  Stress: Not on file  Social Connections: Not on file  Intimate Partner Violence: Not on file    FAMILY HISTORY: Family History  Problem Relation Age of Onset   Diabetes Mother    Heart disease Mother    Alcohol abuse Father    Sleep apnea Sister     ALLERGIES:  has No Known Allergies.  MEDICATIONS:  Current Outpatient Medications  Medication Sig Dispense Refill   amLODipine-benazepril (LOTREL) 10-20 MG capsule Take 1 capsule by mouth daily.     brimonidine (ALPHAGAN) 0.2 % ophthalmic solution Place 1 drop into the left eye daily.     atorvastatin (LIPITOR) 40 MG tablet Take 40 mg by mouth daily.     Azelastine HCl 137 MCG/SPRAY SOLN 1-2 puffs in each nostril     carvedilol (COREG) 25 MG tablet Take 25 mg by mouth 2 (two) times daily with a meal.      chlorthalidone (HYGROTON) 25 MG tablet Take 12.5 mg by mouth daily.      Coenzyme Q10 (COQ-10) 100 MG CAPS      cyclobenzaprine (FLEXERIL) 10 MG tablet TAKE 1/2 TO 1 TABLET BY MOUTH AT NIGHT AS NEEDED 30 tablet 3   FARXIGA 5 MG TABS tablet Take 5 mg by mouth daily.     fish oil-omega-3 fatty acids 1000 MG capsule Take 1,200 mg by mouth daily.     gabapentin (NEURONTIN) 300 MG capsule Take 300 mg by mouth daily.  1   latanoprost (XALATAN) 0.005 % ophthalmic solution SMARTSIG:1 Drop(s) In Eye(s) Every Evening     levocetirizine (XYZAL) 5 MG tablet Take 5 mg by mouth every evening.      montelukast (SINGULAIR) 10 MG tablet Take 10 mg by mouth.     Multiple Vitamins-Minerals (MULTI FOR HER 50+ PO) Take 1 tablet by mouth daily.     TURMERIC PO Take by mouth.     No current facility-administered medications for this visit.    REVIEW OF SYSTEMS:   Eyes: Denies blurriness of vision, double vision or watery eyes Ears, nose, mouth, throat, and face: Denies mucositis or sore throat Respiratory: Denies cough, dyspnea or wheezes Cardiovascular: Denies palpitation, chest discomfort or lower  extremity swelling Gastrointestinal:  Denies nausea, heartburn or change in bowel habits Skin: Denies abnormal skin rashes Lymphatics: Denies new lymphadenopathy or easy bruising Neurological:Denies numbness, tingling or new weaknesses Behavioral/Psych: Mood is stable, no new changes  All other systems were reviewed with the patient and are negative.  PHYSICAL EXAMINATION: ECOG PERFORMANCE STATUS: 0 - Asymptomatic  Vitals:   06/26/22 1309  BP: 135/61  Pulse: 64  Resp: 18  Temp: 100 F (37.8 C)  SpO2: 96%   Filed Weights   06/26/22 1309  Weight: 191 lb 12.8 oz (87 kg)    GENERAL:alert, no distress and comfortable SKIN: skin color, texture, turgor are normal, no rashes or significant lesions EYES: normal, conjunctiva are pink and non-injected, sclera clear OROPHARYNX:no exudate, no erythema and lips, buccal mucosa, and tongue normal  NECK: supple, thyroid normal size, non-tender, without nodularity LYMPH:  no palpable lymphadenopathy in the cervical, axillary or inguinal LUNGS: clear to auscultation and percussion with normal breathing effort HEART: regular rate & rhythm and no murmurs and no lower extremity edema ABDOMEN:abdomen soft, non-tender and normal bowel sounds Musculoskeletal:no cyanosis of digits and no clubbing  PSYCH: alert & oriented x 3 with fluent speech NEURO: no focal motor/sensory deficits  LABORATORY DATA:  I have reviewed the data as listed Lab  Results  Component Value Date   WBC 12.1 (H) 09/13/2008   HGB 10.9 (L) 09/13/2008   HCT 31.7 (L) 09/13/2008   MCV 90.8 09/13/2008   PLT 191 09/13/2008    RADIOGRAPHIC STUDIES: I have personally reviewed the radiological images as listed and agreed with the findings in the report. US RENAL  Result Date: 06/20/2022 CLINICAL DATA:  Chronic renal disease.  Hypercalcemia. EXAM: RENAL / URINARY TRACT ULTRASOUND COMPLETE COMPARISON:  None Available. FINDINGS: Right Kidney: Renal measurements: 10.1 x 0.3 x 5.0 cm = volume: 115 mL. Increased cortical echogenicity. Contains numerous cysts. The largest measures 4.4 cm. No follow-up imaging recommended for the cysts. Left Kidney: Renal measurements: 9.4 x 4.7 x 5.1 cm = volume: 116 mL. Increased cortical echogenicity. Contains numerous cysts, simple and complicated. Largest cyst measures 3.5 cm. No follow-up imaging recommended for the cysts. Bladder: Appears normal for degree of bladder distention. Other: None. IMPRESSION: 1. Increased cortical echogenicity in both kidneys consistent with medical renal disease. 2. Numerous cysts in both kidneys. No follow-up imaging recommended for the cysts. 3. No other abnormalities. Electronically Signed   By: Gerome Sam III M.D.   On: 06/20/2022 18:57

## 2022-06-26 NOTE — Assessment & Plan Note (Signed)
We discussed importance of risk factor modification  

## 2022-06-26 NOTE — Assessment & Plan Note (Signed)
She had recent acute on chronic renal failure, could be related to her medication, uncontrolled diabetes and others We discussed importance of adequate hydration before she returns for her next visit

## 2022-06-26 NOTE — Assessment & Plan Note (Signed)
Cause is unknown, could be exacerbated by dehydration We will repeat labs again next month for further follow-up

## 2022-07-03 DIAGNOSIS — H401111 Primary open-angle glaucoma, right eye, mild stage: Secondary | ICD-10-CM | POA: Diagnosis not present

## 2022-07-03 DIAGNOSIS — G4733 Obstructive sleep apnea (adult) (pediatric): Secondary | ICD-10-CM | POA: Diagnosis not present

## 2022-07-15 IMAGING — MG MM DIGITAL DIAGNOSTIC UNILAT*R*
3 series · 3 of 3 positions shown · non-contrast
Comparison: Previous exam(s).

CLINICAL DATA: Screening recall for right breast calcifications.

EXAM:
DIGITAL DIAGNOSTIC UNILATERAL RIGHT MAMMOGRAM
TECHNIQUE: Right digital diagnostic mammography was performed. Mammographic
images were processed with CAD.

[R ML (1 of 2)]
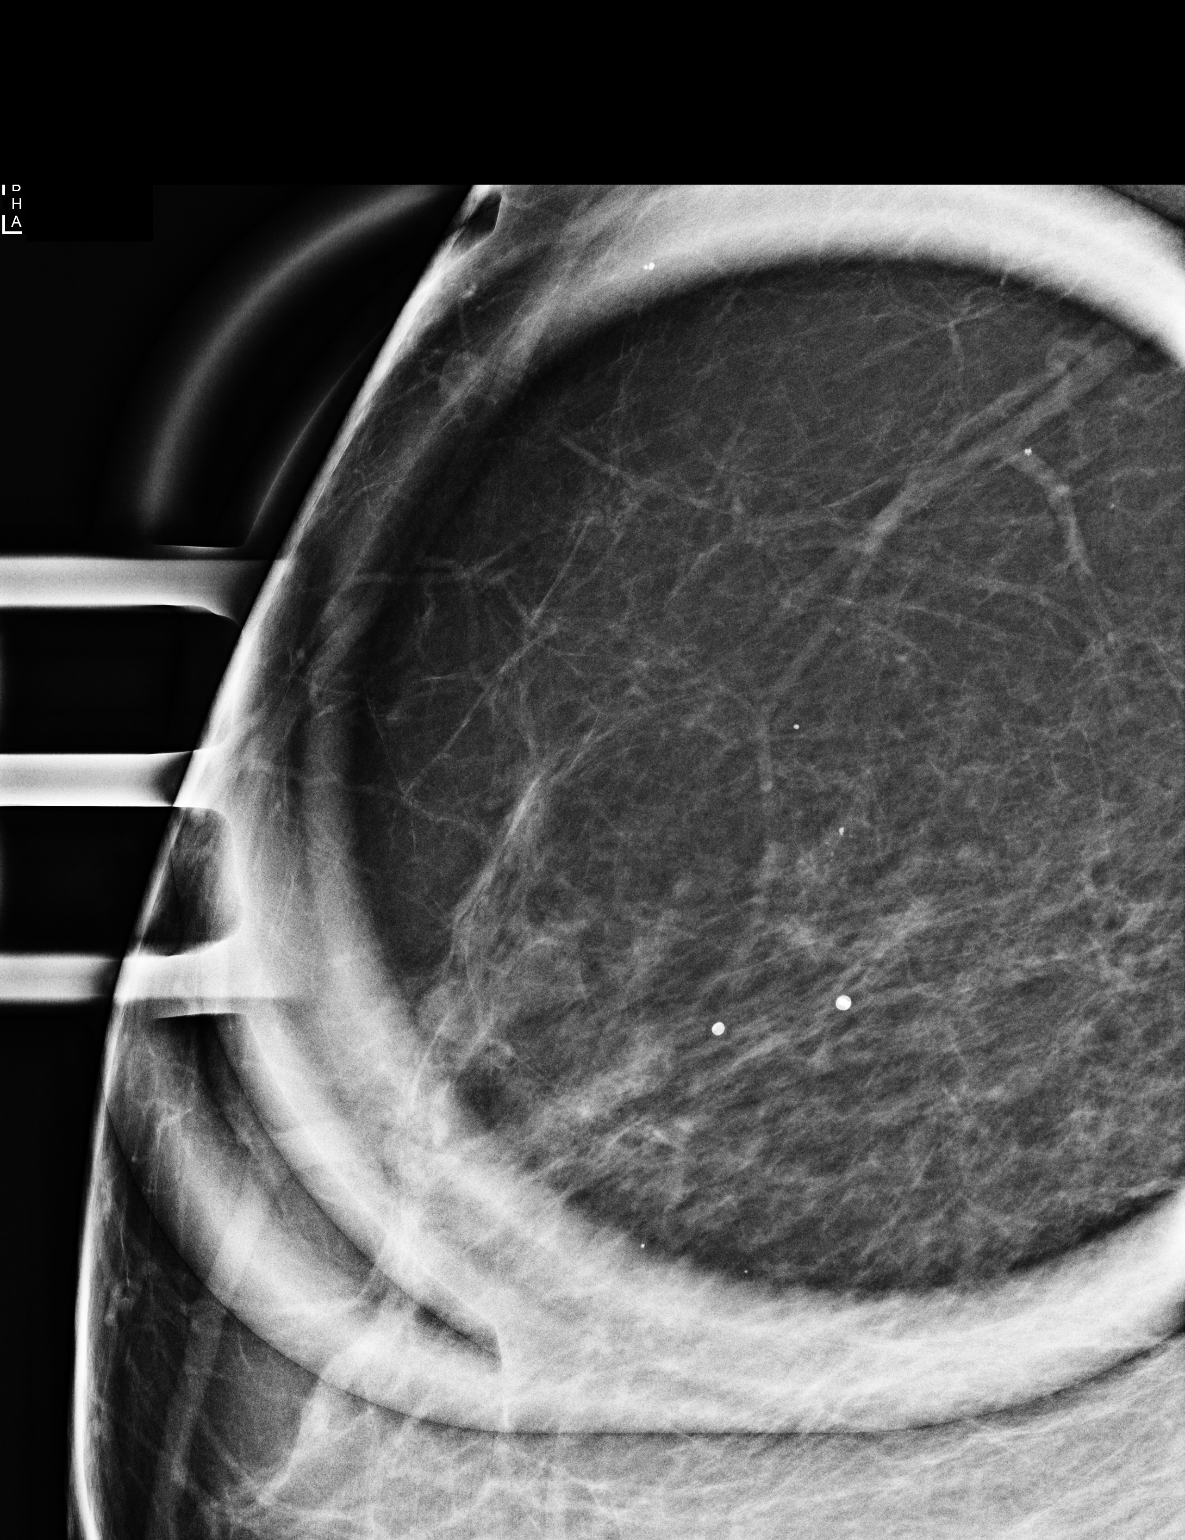

[R CC]
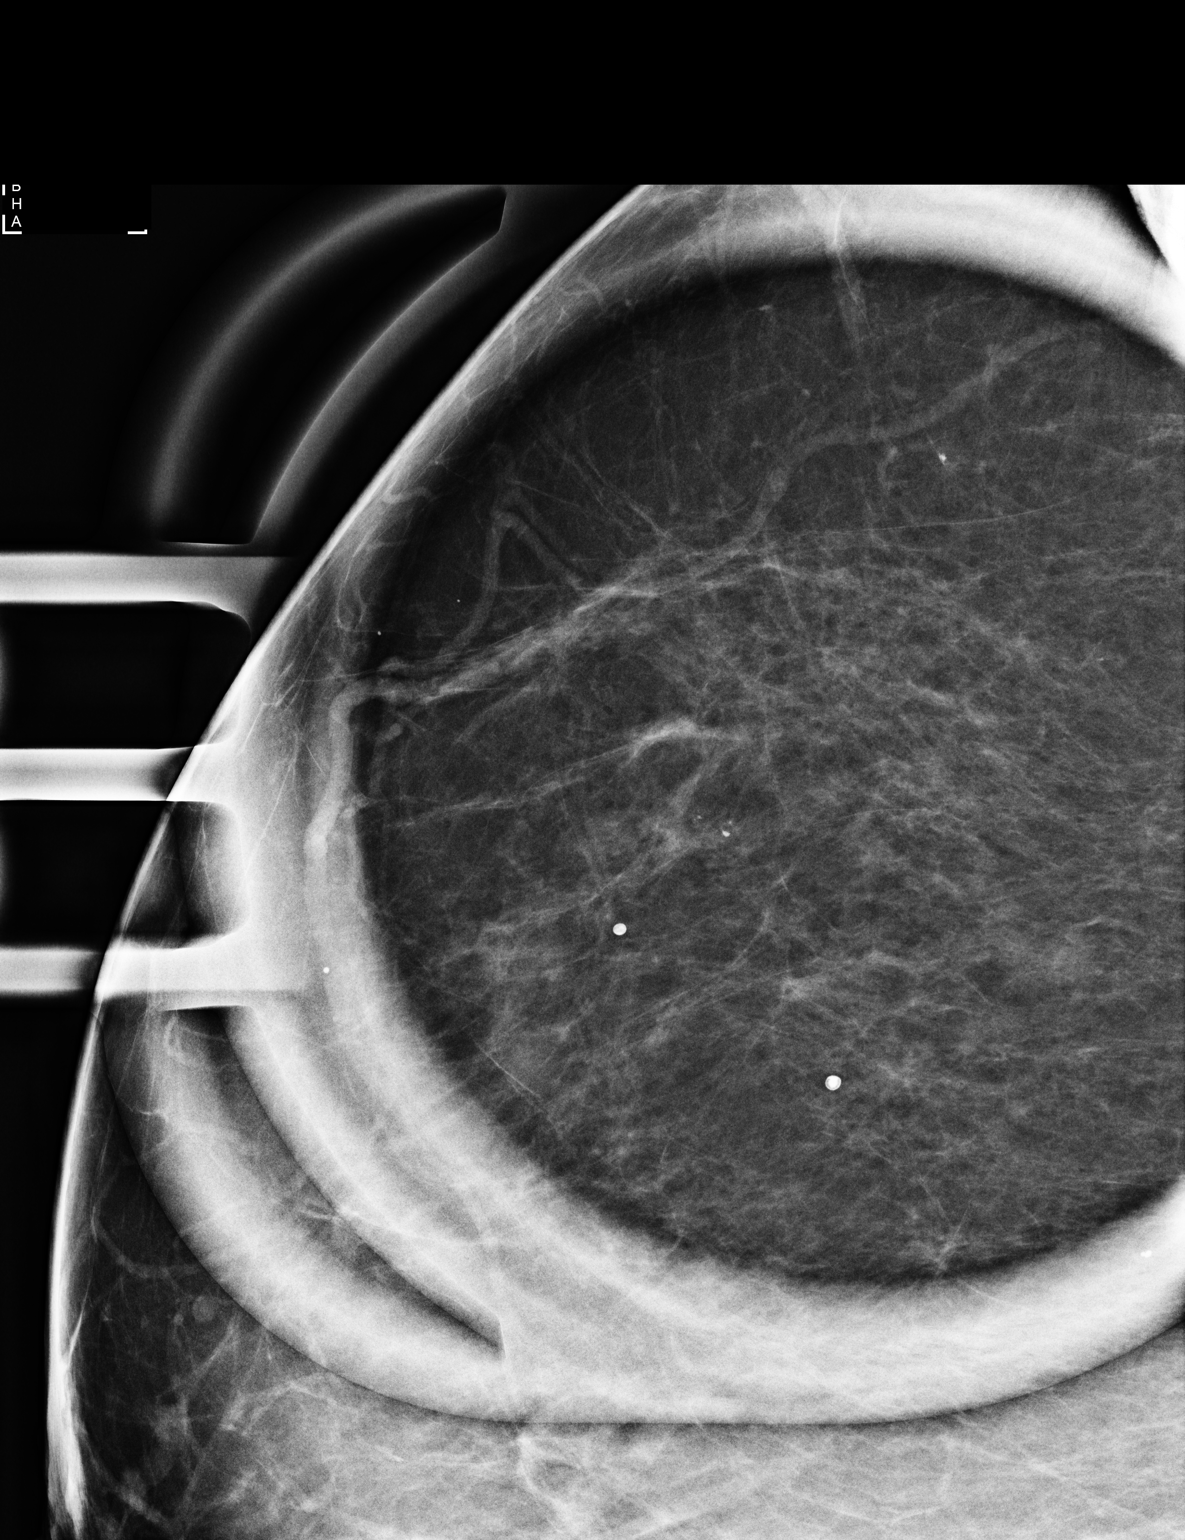

[R ML (2 of 2)]
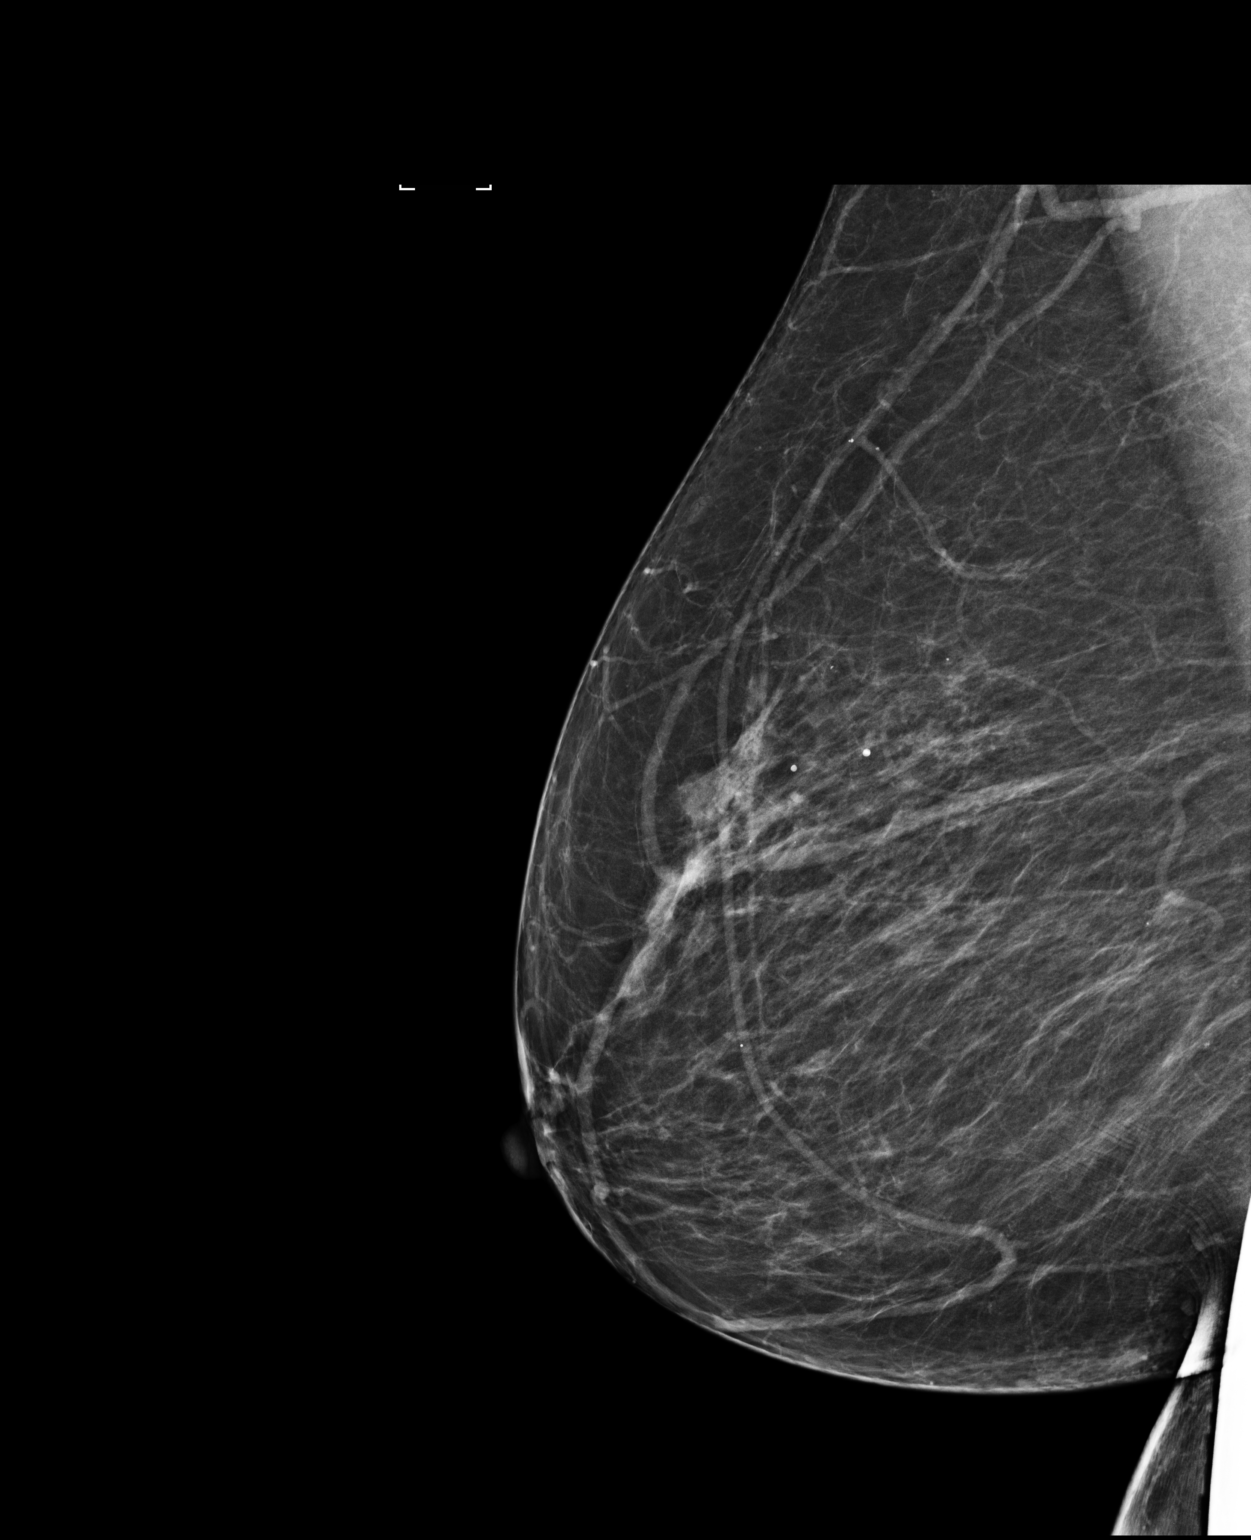

[3 of 3 positions shown; findings below may reference images not displayed]

ACR Breast Density Category b: There are scattered areas of
fibroglandular density.
FINDINGS: Spot compression magnification images over the calcifications in the
upper-outer quadrant demonstrate 3 punctate calcifications. These
have had no significant change over multiple prior mammograms.
IMPRESSION: No suspicious grouped calcifications are identified in the right
breast.

RECOMMENDATION:
Screening mammogram in one year.(Code:[id number])

I have discussed the findings and recommendations with the patient.
If applicable, a reminder letter will be sent to the patient
regarding the next appointment.

BI-RADS CATEGORY  2: Benign.

## 2022-07-26 ENCOUNTER — Other Ambulatory Visit: Payer: Self-pay | Admitting: *Deleted

## 2022-07-26 ENCOUNTER — Ambulatory Visit (HOSPITAL_COMMUNITY)
Admission: RE | Admit: 2022-07-26 | Discharge: 2022-07-26 | Disposition: A | Discharge status: Home / Self Care | Payer: Medicare Other | Source: Ambulatory Visit | Attending: Hematology and Oncology | Admitting: Hematology and Oncology

## 2022-07-26 ENCOUNTER — Inpatient Hospital Stay: Payer: Medicare Other | Attending: Hematology and Oncology

## 2022-07-26 ENCOUNTER — Other Ambulatory Visit: Payer: Medicare Other

## 2022-07-26 DIAGNOSIS — D472 Monoclonal gammopathy: Secondary | ICD-10-CM | POA: Insufficient documentation

## 2022-07-26 DIAGNOSIS — N184 Chronic kidney disease, stage 4 (severe): Secondary | ICD-10-CM | POA: Insufficient documentation

## 2022-07-26 DIAGNOSIS — D631 Anemia in chronic kidney disease: Secondary | ICD-10-CM | POA: Insufficient documentation

## 2022-07-26 LAB — CBC WITH DIFFERENTIAL (CANCER CENTER ONLY)
Abs Immature Granulocytes: 0.01 10*3/uL (ref 0.00–0.07)
Basophils Absolute: 0 10*3/uL (ref 0.0–0.1)
Basophils Relative: 1 %
Eosinophils Absolute: 0.3 10*3/uL (ref 0.0–0.5)
Eosinophils Relative: 5 %
HCT: 33.3 % — ABNORMAL LOW (ref 36.0–46.0)
Hemoglobin: 10.9 g/dL — ABNORMAL LOW (ref 12.0–15.0)
Immature Granulocytes: 0 %
Lymphocytes Relative: 43 %
Lymphs Abs: 2.3 10*3/uL (ref 0.7–4.0)
MCH: 30.1 pg (ref 26.0–34.0)
MCHC: 32.7 g/dL (ref 30.0–36.0)
MCV: 92 fL (ref 80.0–100.0)
Monocytes Absolute: 0.5 10*3/uL (ref 0.1–1.0)
Monocytes Relative: 10 %
Neutro Abs: 2.2 10*3/uL (ref 1.7–7.7)
Neutrophils Relative %: 41 %
Platelet Count: 211 10*3/uL (ref 150–400)
RBC: 3.62 MIL/uL — ABNORMAL LOW (ref 3.87–5.11)
RDW: 12 % (ref 11.5–15.5)
WBC Count: 5.3 10*3/uL (ref 4.0–10.5)
nRBC: 0 % (ref 0.0–0.2)

## 2022-07-26 LAB — CMP (CANCER CENTER ONLY)
ALT: 16 U/L (ref 0–44)
AST: 16 U/L (ref 15–41)
Albumin: 4.1 g/dL (ref 3.5–5.0)
Alkaline Phosphatase: 69 U/L (ref 38–126)
Anion gap: 4 — ABNORMAL LOW (ref 5–15)
BUN: 35 mg/dL — ABNORMAL HIGH (ref 8–23)
CO2: 27 mmol/L (ref 22–32)
Calcium: 10.3 mg/dL (ref 8.9–10.3)
Chloride: 108 mmol/L (ref 98–111)
Creatinine: 1.94 mg/dL — ABNORMAL HIGH (ref 0.44–1.00)
GFR, Estimated: 27 mL/min — ABNORMAL LOW (ref >60)
Glucose, Bld: 170 mg/dL — ABNORMAL HIGH (ref 70–99)
Potassium: 4.6 mmol/L (ref 3.5–5.1)
Sodium: 139 mmol/L (ref 135–145)
Total Bilirubin: 0.5 mg/dL (ref 0.3–1.2)
Total Protein: 7.3 g/dL (ref 6.5–8.1)

## 2022-07-26 LAB — LACTATE DEHYDROGENASE: LDH: 143 U/L (ref 98–192)

## 2022-07-27 LAB — KAPPA/LAMBDA LIGHT CHAINS
Kappa free light chain: 113.1 mg/L — ABNORMAL HIGH (ref 3.3–19.4)
Kappa, lambda light chain ratio: 2.45 — ABNORMAL HIGH (ref 0.26–1.65)
Lambda free light chains: 46.2 mg/L — ABNORMAL HIGH (ref 5.7–26.3)

## 2022-07-27 LAB — BETA 2 MICROGLOBULIN, SERUM: Beta-2 Microglobulin: 3.8 mg/L — ABNORMAL HIGH (ref 0.6–2.4)

## 2022-07-28 LAB — UPEP/TP, 24-HR URINE
Albumin, U: 100 %
Alpha 1, Urine: 0 %
Alpha 2, Urine: 0 %
Beta, Urine: 0 %
Gamma Globulin, Urine: 0 %
Total Protein, Urine-Ur/day: 130 mg/24 hr (ref 30–150)
Total Protein, Urine: 5.4 mg/dL
Total Volume: 2400

## 2022-07-31 DIAGNOSIS — G4733 Obstructive sleep apnea (adult) (pediatric): Secondary | ICD-10-CM | POA: Diagnosis not present

## 2022-08-03 LAB — MULTIPLE MYELOMA PANEL, SERUM
Albumin SerPl Elph-Mcnc: 3.6 g/dL (ref 2.9–4.4)
Albumin/Glob SerPl: 1.1 (ref 0.7–1.7)
Alpha 1: 0.2 g/dL (ref 0.0–0.4)
Alpha2 Glob SerPl Elph-Mcnc: 0.7 g/dL (ref 0.4–1.0)
B-Globulin SerPl Elph-Mcnc: 1.1 g/dL (ref 0.7–1.3)
Gamma Glob SerPl Elph-Mcnc: 1.4 g/dL (ref 0.4–1.8)
Globulin, Total: 3.4 g/dL (ref 2.2–3.9)
IgA: 143 mg/dL (ref 87–352)
IgG (Immunoglobin G), Serum: 1374 mg/dL (ref 586–1602)
IgM (Immunoglobulin M), Srm: 80 mg/dL (ref 26–217)
M Protein SerPl Elph-Mcnc: 0.5 g/dL — ABNORMAL HIGH
Total Protein ELP: 7 g/dL (ref 6.0–8.5)

## 2022-08-04 ENCOUNTER — Encounter: Payer: Self-pay | Admitting: Hematology and Oncology

## 2022-08-04 ENCOUNTER — Inpatient Hospital Stay: Payer: Medicare Other | Admitting: Hematology and Oncology

## 2022-08-04 VITALS — BP 134/51 | HR 61 | Temp 99.2°F | Resp 18 | Ht 60.0 in | Wt 192.2 lb

## 2022-08-04 DIAGNOSIS — D631 Anemia in chronic kidney disease: Secondary | ICD-10-CM

## 2022-08-04 DIAGNOSIS — D472 Monoclonal gammopathy: Secondary | ICD-10-CM | POA: Diagnosis not present

## 2022-08-04 DIAGNOSIS — N183 Chronic kidney disease, stage 3 unspecified: Secondary | ICD-10-CM

## 2022-08-04 DIAGNOSIS — N184 Chronic kidney disease, stage 4 (severe): Secondary | ICD-10-CM | POA: Diagnosis not present

## 2022-08-04 NOTE — Assessment & Plan Note (Signed)
Review test results and imaging study We discussed natural history of MGUS Her anemia and chronic kidney disease is not related to MGUS I will see her once a year

## 2022-08-04 NOTE — Assessment & Plan Note (Signed)
This is likely anemia of chronic disease. The patient denies recent history of bleeding such as epistaxis, hematuria or hematochezia. She is asymptomatic from the anemia. We will observe for now.  She does not require transfusion now. I do not recommend any further work-up at this time.   

## 2022-08-04 NOTE — Progress Notes (Signed)
[facility] OFFICE PROGRESS NOTE  Patient Care Team: Daisy Floro, MD as PCP - General (Family Medicine)  ASSESSMENT & PLAN:  MGUS (monoclonal gammopathy of unknown significance) Review test results and imaging study We discussed natural history of MGUS Her anemia and chronic kidney disease is not related to MGUS I will see her once a year  CKD (chronic kidney disease), stage III (HCC) She has borderline chronic kidney disease stage IV We discussed importance of adequate hydration and risk factor modification  Anemia in chronic kidney disease (CKD) This is likely anemia of chronic disease. The patient denies recent history of bleeding such as epistaxis, hematuria or hematochezia. She is asymptomatic from the anemia. We will observe for now.  She does not require transfusion now. I do not recommend any further work-up at this time.    Orders Placed This Encounter  Procedures   Lactate dehydrogenase    Standing Status:   Future    Standing Expiration Date:   08/04/2023   CBC with Differential (Cancer Center Only)    Standing Status:   Future    Standing Expiration Date:   08/04/2023   CMP (Cancer Center only)    Standing Status:   Future    Standing Expiration Date:   08/04/2023   Kappa/lambda light chains    Standing Status:   Future    Standing Expiration Date:   08/04/2023   Multiple Myeloma Panel (SPEP&IFE w/QIG)    Standing Status:   Future    Standing Expiration Date:   08/04/2023    All questions were answered. The patient knows to call the clinic with any problems, questions or concerns. The total time spent in the appointment was 20 minutes encounter with patients including review of chart and various tests results, discussions about plan of care and coordination of care plan   Artis Delay, MD 08/04/2022 2:25 PM  INTERVAL HISTORY: Please see below for problem oriented charting. she returns for review test results She is here with her husband We  discussed test results  REVIEW OF SYSTEMS:   Constitutional: Denies fevers, chills or abnormal weight loss Eyes: Denies blurriness of vision Ears, nose, mouth, throat, and face: Denies mucositis or sore throat Respiratory: Denies cough, dyspnea or wheezes Cardiovascular: Denies palpitation, chest discomfort or lower extremity swelling Gastrointestinal:  Denies nausea, heartburn or change in bowel habits Skin: Denies abnormal skin rashes Lymphatics: Denies new lymphadenopathy or easy bruising Neurological:Denies numbness, tingling or new weaknesses Behavioral/Psych: Mood is stable, no new changes  All other systems were reviewed with the patient and are negative.  I have reviewed the past medical history, past surgical history, social history and family history with the patient and they are unchanged from previous note.  ALLERGIES:  has No Known Allergies.  MEDICATIONS:  Current Outpatient Medications  Medication Sig Dispense Refill   amLODipine-benazepril (LOTREL) 10-20 MG capsule Take 1 capsule by mouth daily.     atorvastatin (LIPITOR) 40 MG tablet Take 40 mg by mouth daily.     Azelastine HCl 137 MCG/SPRAY SOLN 1-2 puffs in each nostril     brimonidine (ALPHAGAN) 0.2 % ophthalmic solution Place 1 drop into the left eye daily.     carvedilol (COREG) 25 MG tablet Take 25 mg by mouth 2 (two) times daily with a meal.      chlorthalidone (HYGROTON) 25 MG tablet Take 12.5 mg by mouth daily.      Coenzyme Q10 (COQ-10) 100 MG CAPS  cyclobenzaprine (FLEXERIL) 10 MG tablet TAKE 1/2 TO 1 TABLET BY MOUTH AT NIGHT AS NEEDED 30 tablet 3   FARXIGA 5 MG TABS tablet Take 5 mg by mouth daily.     fish oil-omega-3 fatty acids 1000 MG capsule Take 1,200 mg by mouth daily.     gabapentin (NEURONTIN) 300 MG capsule Take 300 mg by mouth daily.  1   latanoprost (XALATAN) 0.005 % ophthalmic solution SMARTSIG:1 Drop(s) In Eye(s) Every Evening     levocetirizine (XYZAL) 5 MG tablet Take 5 mg by mouth  every evening.     montelukast (SINGULAIR) 10 MG tablet Take 10 mg by mouth.     Multiple Vitamins-Minerals (MULTI FOR HER 50+ PO) Take 1 tablet by mouth daily.     TURMERIC PO Take by mouth.     No current facility-administered medications for this visit.    SUMMARY OF ONCOLOGIC HISTORY: Oncology History   No history exists.    PHYSICAL EXAMINATION: ECOG PERFORMANCE STATUS: 0 - Asymptomatic  Vitals:   08/04/22 1155  BP: (!) 134/51  Pulse: 61  Resp: 18  Temp: 99.2 F (37.3 C)  SpO2: 97%   Filed Weights   08/04/22 1155  Weight: 192 lb 3.2 oz (87.2 kg)    GENERAL:alert, no distress and comfortable NEURO: alert & oriented x 3 with fluent speech, no focal motor/sensory deficits  LABORATORY DATA:  I have reviewed the data as listed    Component Value Date/Time   NA 139 07/26/2022 0907   NA 146 (H) 10/04/2020 0906   K 4.6 07/26/2022 0907   CL 108 07/26/2022 0907   CO2 27 07/26/2022 0907   GLUCOSE 170 (H) 07/26/2022 0907   BUN 35 (H) 07/26/2022 0907   BUN 32 (H) 10/04/2020 0906   CREATININE 1.94 (H) 07/26/2022 0907   CALCIUM 10.3 07/26/2022 0907   PROT 7.3 07/26/2022 0907   PROT 7.0 10/04/2020 0906   ALBUMIN 4.1 07/26/2022 0907   ALBUMIN 4.7 10/04/2020 0906   AST 16 07/26/2022 0907   ALT 16 07/26/2022 0907   ALKPHOS 69 07/26/2022 0907   BILITOT 0.5 07/26/2022 0907   GFRNONAA 27 (L) 07/26/2022 0907   GFRAA  09/13/2008 0657    >60        The eGFR has been calculated using the MDRD equation. This calculation has not been validated in all clinical situations. eGFR's persistently <60 mL/min signify possible Chronic Kidney Disease.    No results found for: "SPEP", "UPEP"  Lab Results  Component Value Date   WBC 5.3 07/26/2022   NEUTROABS 2.2 07/26/2022   HGB 10.9 (L) 07/26/2022   HCT 33.3 (L) 07/26/2022   MCV 92.0 07/26/2022   PLT 211 07/26/2022      Chemistry      Component Value Date/Time   NA 139 07/26/2022 0907   NA 146 (H) 10/04/2020 0906    K 4.6 07/26/2022 0907   CL 108 07/26/2022 0907   CO2 27 07/26/2022 0907   BUN 35 (H) 07/26/2022 0907   BUN 32 (H) 10/04/2020 0906   CREATININE 1.94 (H) 07/26/2022 0907      Component Value Date/Time   CALCIUM 10.3 07/26/2022 0907   ALKPHOS 69 07/26/2022 0907   AST 16 07/26/2022 0907   ALT 16 07/26/2022 0907   BILITOT 0.5 07/26/2022 0907       RADIOGRAPHIC STUDIES: I have personally reviewed the radiological images as listed and agreed with the findings in the report. DG Bone Survey Met  Result Date: 07/30/2022 CLINICAL DATA:  MGUS, staging myeloma EXAM: METASTATIC BONE SURVEY COMPARISON:  04/12/2013 chest radiograph. FINDINGS: Uncomplicated bilateral total knee arthroplasties. No suspicious lytic or sclerotic lesions of the calvarium, spine, clavicles, ribs, shoulder girdles, pelvic girdle or extremity long bones. Surgical clips overlie the medial lower right neck. IMPRESSION: No suspicious bone lesions. Electronically Signed   By: Delbert Phenix M.D.   On: 07/30/2022 10:57

## 2022-08-04 NOTE — Assessment & Plan Note (Signed)
She has borderline chronic kidney disease stage IV We discussed importance of adequate hydration and risk factor modification

## 2022-08-08 ENCOUNTER — Telehealth: Payer: Self-pay | Admitting: Hematology and Oncology

## 2022-08-08 NOTE — Telephone Encounter (Signed)
Left patient a vm regarding upcoming appointment  

## 2022-09-06 DIAGNOSIS — E1122 Type 2 diabetes mellitus with diabetic chronic kidney disease: Secondary | ICD-10-CM | POA: Diagnosis not present

## 2022-09-06 DIAGNOSIS — D472 Monoclonal gammopathy: Secondary | ICD-10-CM | POA: Diagnosis not present

## 2022-09-06 DIAGNOSIS — E785 Hyperlipidemia, unspecified: Secondary | ICD-10-CM | POA: Diagnosis not present

## 2022-09-06 DIAGNOSIS — N1832 Chronic kidney disease, stage 3b: Secondary | ICD-10-CM | POA: Diagnosis not present

## 2022-09-06 DIAGNOSIS — E1169 Type 2 diabetes mellitus with other specified complication: Secondary | ICD-10-CM | POA: Diagnosis not present

## 2022-09-11 ENCOUNTER — Other Ambulatory Visit: Payer: Self-pay | Admitting: Family Medicine

## 2022-09-11 DIAGNOSIS — Z1231 Encounter for screening mammogram for malignant neoplasm of breast: Secondary | ICD-10-CM

## 2022-09-12 DIAGNOSIS — Z0000 Encounter for general adult medical examination without abnormal findings: Secondary | ICD-10-CM | POA: Diagnosis not present

## 2022-09-15 ENCOUNTER — Ambulatory Visit
Admission: RE | Admit: 2022-09-15 | Discharge: 2022-09-15 | Disposition: A | Discharge status: Home / Self Care | Payer: Medicare Other | Source: Ambulatory Visit | Attending: Family Medicine | Admitting: Family Medicine

## 2022-09-15 DIAGNOSIS — Z1231 Encounter for screening mammogram for malignant neoplasm of breast: Secondary | ICD-10-CM | POA: Diagnosis not present

## 2022-09-18 DIAGNOSIS — J309 Allergic rhinitis, unspecified: Secondary | ICD-10-CM | POA: Diagnosis not present

## 2022-09-18 DIAGNOSIS — E1122 Type 2 diabetes mellitus with diabetic chronic kidney disease: Secondary | ICD-10-CM | POA: Diagnosis not present

## 2022-09-18 DIAGNOSIS — I10 Essential (primary) hypertension: Secondary | ICD-10-CM | POA: Diagnosis not present

## 2022-09-18 DIAGNOSIS — M542 Cervicalgia: Secondary | ICD-10-CM | POA: Diagnosis not present

## 2022-09-18 DIAGNOSIS — Z8639 Personal history of other endocrine, nutritional and metabolic disease: Secondary | ICD-10-CM | POA: Diagnosis not present

## 2022-09-18 DIAGNOSIS — E1169 Type 2 diabetes mellitus with other specified complication: Secondary | ICD-10-CM | POA: Diagnosis not present

## 2022-09-18 DIAGNOSIS — Z23 Encounter for immunization: Secondary | ICD-10-CM | POA: Diagnosis not present

## 2022-09-18 DIAGNOSIS — E7800 Pure hypercholesterolemia, unspecified: Secondary | ICD-10-CM | POA: Diagnosis not present

## 2022-09-18 DIAGNOSIS — Z0000 Encounter for general adult medical examination without abnormal findings: Secondary | ICD-10-CM | POA: Diagnosis not present

## 2022-09-18 DIAGNOSIS — N1832 Chronic kidney disease, stage 3b: Secondary | ICD-10-CM | POA: Diagnosis not present

## 2022-10-09 ENCOUNTER — Encounter: Payer: Self-pay | Admitting: Podiatry

## 2022-10-09 ENCOUNTER — Ambulatory Visit: Payer: Medicare Other | Admitting: Podiatry

## 2022-10-09 DIAGNOSIS — E1142 Type 2 diabetes mellitus with diabetic polyneuropathy: Secondary | ICD-10-CM

## 2022-10-09 DIAGNOSIS — M21612 Bunion of left foot: Secondary | ICD-10-CM | POA: Diagnosis not present

## 2022-10-09 DIAGNOSIS — M21611 Bunion of right foot: Secondary | ICD-10-CM

## 2022-10-09 NOTE — Progress Notes (Signed)
  Subjective:  Patient ID: [name], female    DOB: Apr 12, 1950,   MRN: [id number]  No chief complaint on file.   72 y.o. female presents for diabetic foot check. Relates burning and tingling in their feet. Patient is diabetic and last A1c was unknown.   PCP:  Daisy Floro, MD    . Denies any other pedal complaints. Denies n/v/f/c.   Past Medical History:  Diagnosis Date   Diabetes mellitus 03/13/2010   Glaucoma    Hyperlipidemia    Hypertension    Obesity     Objective:  Physical Exam: Vascular: DP/PT pulses 2/4 bilateral. CFT <3 seconds. Absent hair growth on digits. Edema noted to bilateral lower extremities. Xerosis noted bilaterally.  Skin. No lacerations or abrasions bilateral feet. Nails 1-5 bilateral  are normal in appearance.  Musculoskeletal: MMT 5/5 bilateral lower extremities in DF, PF, Inversion and Eversion. Deceased ROM in DF of ankle joint. Bilateral mild HAV deformities noted.  Neurological: Sensation intact to light touch. Protective sensation intact bilateral.    Assessment:   1. Type 2 diabetes mellitus with peripheral neuropathy (HCC)   2. Bilateral bunions      Plan:  Patient was evaluated and treated and all questions answered. -Discussed and educated patient on diabetic foot care, especially with  regards to the vascular, neurological and musculoskeletal systems.  -Stressed the importance of good glycemic control and the detriment of not  controlling glucose levels in relation to the foot. -Discussed supportive shoes at all times and checking feet regularly.  -Discussed DM shoes will get ordered.  -Answered all patient questions -Patient to return  in 1 year for DM foot check.  -Patient advised to call the office if any problems or questions arise in the meantime.   Louann Sjogren, DPM

## 2022-10-11 DIAGNOSIS — M25561 Pain in right knee: Secondary | ICD-10-CM | POA: Diagnosis not present

## 2022-11-06 DIAGNOSIS — H401111 Primary open-angle glaucoma, right eye, mild stage: Secondary | ICD-10-CM | POA: Diagnosis not present

## 2022-11-22 ENCOUNTER — Ambulatory Visit: Payer: Medicare Other | Admitting: Adult Health

## 2023-01-18 DIAGNOSIS — J069 Acute upper respiratory infection, unspecified: Secondary | ICD-10-CM | POA: Diagnosis not present

## 2023-01-29 DIAGNOSIS — J22 Unspecified acute lower respiratory infection: Secondary | ICD-10-CM | POA: Diagnosis not present

## 2023-03-13 DIAGNOSIS — R52 Pain, unspecified: Secondary | ICD-10-CM | POA: Diagnosis not present

## 2023-03-13 DIAGNOSIS — R5383 Other fatigue: Secondary | ICD-10-CM | POA: Diagnosis not present

## 2023-03-13 DIAGNOSIS — R051 Acute cough: Secondary | ICD-10-CM | POA: Diagnosis not present

## 2023-03-19 DIAGNOSIS — E1169 Type 2 diabetes mellitus with other specified complication: Secondary | ICD-10-CM | POA: Diagnosis not present

## 2023-03-19 DIAGNOSIS — E1122 Type 2 diabetes mellitus with diabetic chronic kidney disease: Secondary | ICD-10-CM | POA: Diagnosis not present

## 2023-03-19 DIAGNOSIS — N1832 Chronic kidney disease, stage 3b: Secondary | ICD-10-CM | POA: Diagnosis not present

## 2023-03-19 DIAGNOSIS — M5430 Sciatica, unspecified side: Secondary | ICD-10-CM | POA: Diagnosis not present

## 2023-03-19 DIAGNOSIS — I10 Essential (primary) hypertension: Secondary | ICD-10-CM | POA: Diagnosis not present

## 2023-03-19 DIAGNOSIS — M542 Cervicalgia: Secondary | ICD-10-CM | POA: Diagnosis not present

## 2023-03-26 DIAGNOSIS — N1832 Chronic kidney disease, stage 3b: Secondary | ICD-10-CM | POA: Diagnosis not present

## 2023-03-26 DIAGNOSIS — E1169 Type 2 diabetes mellitus with other specified complication: Secondary | ICD-10-CM | POA: Diagnosis not present

## 2023-03-26 DIAGNOSIS — E1122 Type 2 diabetes mellitus with diabetic chronic kidney disease: Secondary | ICD-10-CM | POA: Diagnosis not present

## 2023-03-26 DIAGNOSIS — N189 Chronic kidney disease, unspecified: Secondary | ICD-10-CM | POA: Diagnosis not present

## 2023-03-26 DIAGNOSIS — D631 Anemia in chronic kidney disease: Secondary | ICD-10-CM | POA: Diagnosis not present

## 2023-03-26 DIAGNOSIS — D472 Monoclonal gammopathy: Secondary | ICD-10-CM | POA: Diagnosis not present

## 2023-03-26 DIAGNOSIS — E785 Hyperlipidemia, unspecified: Secondary | ICD-10-CM | POA: Diagnosis not present

## 2023-03-26 DIAGNOSIS — N2581 Secondary hyperparathyroidism of renal origin: Secondary | ICD-10-CM | POA: Diagnosis not present

## 2023-03-26 DIAGNOSIS — I129 Hypertensive chronic kidney disease with stage 1 through stage 4 chronic kidney disease, or unspecified chronic kidney disease: Secondary | ICD-10-CM | POA: Diagnosis not present

## 2023-03-26 DIAGNOSIS — N183 Chronic kidney disease, stage 3 unspecified: Secondary | ICD-10-CM | POA: Diagnosis not present

## 2023-03-30 DIAGNOSIS — H401111 Primary open-angle glaucoma, right eye, mild stage: Secondary | ICD-10-CM | POA: Diagnosis not present

## 2023-06-04 ENCOUNTER — Ambulatory Visit: Payer: Medicare Other | Admitting: Podiatry

## 2023-06-04 ENCOUNTER — Encounter: Payer: Self-pay | Admitting: Podiatry

## 2023-06-04 DIAGNOSIS — M21611 Bunion of right foot: Secondary | ICD-10-CM | POA: Diagnosis not present

## 2023-06-04 DIAGNOSIS — E1142 Type 2 diabetes mellitus with diabetic polyneuropathy: Secondary | ICD-10-CM

## 2023-06-04 DIAGNOSIS — M21612 Bunion of left foot: Secondary | ICD-10-CM | POA: Diagnosis not present

## 2023-06-04 NOTE — Progress Notes (Signed)
  Subjective:  Patient ID: [name], female    DOB: 1950/07/28,   MRN: [id number]  No chief complaint on file.   73 y.o. female presents for diabetic foot check. Relates burning and tingling in their feet. Patient is diabetic and last A1c was 7   PCP:  Daisy Floro, MD    . Denies any other pedal complaints. Denies n/v/f/c.   Past Medical History:  Diagnosis Date   Diabetes mellitus 03/13/2010   Glaucoma    Hyperlipidemia    Hypertension    Obesity     Objective:  Physical Exam: Vascular: DP/PT pulses 2/4 bilateral. CFT <3 seconds. Absent hair growth on digits. Edema noted to bilateral lower extremities. Xerosis noted bilaterally.  Skin. No lacerations or abrasions bilateral feet. Nails 1-5 bilateral  are normal in appearance.  Musculoskeletal: MMT 5/5 bilateral lower extremities in DF, PF, Inversion and Eversion. Deceased ROM in DF of ankle joint. Bilateral mild HAV deformities noted.  Neurological: Sensation intact to light touch. Protective sensation intact bilateral.    Assessment:   1. Type 2 diabetes mellitus with peripheral neuropathy (HCC)   2. Bilateral bunions       Plan:  Patient was evaluated and treated and all questions answered. -Discussed and educated patient on diabetic foot care, especially with  regards to the vascular, neurological and musculoskeletal systems.  -Stressed the importance of good glycemic control and the detriment of not  controlling glucose levels in relation to the foot. -Discussed supportive shoes at all times and checking feet regularly.  -Discussed DM shoes will get ordered.  -Answered all patient questions -Patient to return  in 1 year for DM foot check.  -Patient advised to call the office if any problems or questions arise in the meantime.   Louann Sjogren, DPM

## 2023-06-12 DIAGNOSIS — J309 Allergic rhinitis, unspecified: Secondary | ICD-10-CM | POA: Diagnosis not present

## 2023-06-25 DIAGNOSIS — E785 Hyperlipidemia, unspecified: Secondary | ICD-10-CM | POA: Diagnosis not present

## 2023-06-25 DIAGNOSIS — N183 Chronic kidney disease, stage 3 unspecified: Secondary | ICD-10-CM | POA: Diagnosis not present

## 2023-06-25 DIAGNOSIS — D472 Monoclonal gammopathy: Secondary | ICD-10-CM | POA: Diagnosis not present

## 2023-06-25 DIAGNOSIS — D631 Anemia in chronic kidney disease: Secondary | ICD-10-CM | POA: Diagnosis not present

## 2023-06-25 DIAGNOSIS — I129 Hypertensive chronic kidney disease with stage 1 through stage 4 chronic kidney disease, or unspecified chronic kidney disease: Secondary | ICD-10-CM | POA: Diagnosis not present

## 2023-06-25 DIAGNOSIS — E1169 Type 2 diabetes mellitus with other specified complication: Secondary | ICD-10-CM | POA: Diagnosis not present

## 2023-06-25 DIAGNOSIS — N2581 Secondary hyperparathyroidism of renal origin: Secondary | ICD-10-CM | POA: Diagnosis not present

## 2023-06-25 DIAGNOSIS — N1832 Chronic kidney disease, stage 3b: Secondary | ICD-10-CM | POA: Diagnosis not present

## 2023-06-25 DIAGNOSIS — E559 Vitamin D deficiency, unspecified: Secondary | ICD-10-CM | POA: Diagnosis not present

## 2023-06-25 DIAGNOSIS — E1122 Type 2 diabetes mellitus with diabetic chronic kidney disease: Secondary | ICD-10-CM | POA: Diagnosis not present

## 2023-07-03 DIAGNOSIS — R051 Acute cough: Secondary | ICD-10-CM | POA: Diagnosis not present

## 2023-07-03 DIAGNOSIS — R0982 Postnasal drip: Secondary | ICD-10-CM | POA: Diagnosis not present

## 2023-07-03 DIAGNOSIS — R0981 Nasal congestion: Secondary | ICD-10-CM | POA: Diagnosis not present

## 2023-07-05 ENCOUNTER — Telehealth: Payer: Self-pay | Admitting: Podiatry

## 2023-07-05 NOTE — Telephone Encounter (Signed)
 Called patient to cancel shoe appointment. She should like a referral over to [employer] and [employer].

## 2023-07-12 ENCOUNTER — Other Ambulatory Visit

## 2023-07-14 DIAGNOSIS — J019 Acute sinusitis, unspecified: Secondary | ICD-10-CM | POA: Diagnosis not present

## 2023-07-14 DIAGNOSIS — R300 Dysuria: Secondary | ICD-10-CM | POA: Diagnosis not present

## 2023-07-30 ENCOUNTER — Inpatient Hospital Stay: Payer: Medicare Other | Attending: Internal Medicine

## 2023-07-30 DIAGNOSIS — D472 Monoclonal gammopathy: Secondary | ICD-10-CM

## 2023-07-30 DIAGNOSIS — N183 Chronic kidney disease, stage 3 unspecified: Secondary | ICD-10-CM | POA: Diagnosis not present

## 2023-07-30 LAB — CBC WITH DIFFERENTIAL (CANCER CENTER ONLY)
Abs Immature Granulocytes: 0.02 10*3/uL (ref 0.00–0.07)
Basophils Absolute: 0 10*3/uL (ref 0.0–0.1)
Basophils Relative: 1 %
Eosinophils Absolute: 0.3 10*3/uL (ref 0.0–0.5)
Eosinophils Relative: 4 %
HCT: 31.8 % — ABNORMAL LOW (ref 36.0–46.0)
Hemoglobin: 10.6 g/dL — ABNORMAL LOW (ref 12.0–15.0)
Immature Granulocytes: 0 %
Lymphocytes Relative: 37 %
Lymphs Abs: 2.3 10*3/uL (ref 0.7–4.0)
MCH: 30.6 pg (ref 26.0–34.0)
MCHC: 33.3 g/dL (ref 30.0–36.0)
MCV: 91.9 fL (ref 80.0–100.0)
Monocytes Absolute: 0.6 10*3/uL (ref 0.1–1.0)
Monocytes Relative: 10 %
Neutro Abs: 3.1 10*3/uL (ref 1.7–7.7)
Neutrophils Relative %: 48 %
Platelet Count: 245 10*3/uL (ref 150–400)
RBC: 3.46 MIL/uL — ABNORMAL LOW (ref 3.87–5.11)
RDW: 12.4 % (ref 11.5–15.5)
WBC Count: 6.3 10*3/uL (ref 4.0–10.5)
nRBC: 0 % (ref 0.0–0.2)

## 2023-07-30 LAB — CMP (CANCER CENTER ONLY)
ALT: 14 U/L (ref 0–44)
AST: 17 U/L (ref 15–41)
Albumin: 4.2 g/dL (ref 3.5–5.0)
Alkaline Phosphatase: 71 U/L (ref 38–126)
Anion gap: 6 (ref 5–15)
BUN: 42 mg/dL — ABNORMAL HIGH (ref 8–23)
CO2: 25 mmol/L (ref 22–32)
Calcium: 10.2 mg/dL (ref 8.9–10.3)
Chloride: 109 mmol/L (ref 98–111)
Creatinine: 2.15 mg/dL — ABNORMAL HIGH (ref 0.44–1.00)
GFR, Estimated: 24 mL/min — ABNORMAL LOW (ref >60)
Glucose, Bld: 133 mg/dL — ABNORMAL HIGH (ref 70–99)
Potassium: 4.3 mmol/L (ref 3.5–5.1)
Sodium: 140 mmol/L (ref 135–145)
Total Bilirubin: 0.6 mg/dL (ref 0.0–1.2)
Total Protein: 7.4 g/dL (ref 6.5–8.1)

## 2023-07-30 LAB — LACTATE DEHYDROGENASE: LDH: 160 U/L (ref 98–192)

## 2023-07-31 DIAGNOSIS — H401111 Primary open-angle glaucoma, right eye, mild stage: Secondary | ICD-10-CM | POA: Diagnosis not present

## 2023-07-31 LAB — KAPPA/LAMBDA LIGHT CHAINS
Kappa free light chain: 109.4 mg/L — ABNORMAL HIGH (ref 3.3–19.4)
Kappa, lambda light chain ratio: 2.33 — ABNORMAL HIGH (ref 0.26–1.65)
Lambda free light chains: 46.9 mg/L — ABNORMAL HIGH (ref 5.7–26.3)

## 2023-08-01 LAB — MULTIPLE MYELOMA PANEL, SERUM
Albumin SerPl Elph-Mcnc: 3.6 g/dL (ref 2.9–4.4)
Albumin/Glob SerPl: 1.1 (ref 0.7–1.7)
Alpha 1: 0.2 g/dL (ref 0.0–0.4)
Alpha2 Glob SerPl Elph-Mcnc: 0.5 g/dL (ref 0.4–1.0)
B-Globulin SerPl Elph-Mcnc: 1.2 g/dL (ref 0.7–1.3)
Gamma Glob SerPl Elph-Mcnc: 1.4 g/dL (ref 0.4–1.8)
Globulin, Total: 3.4 g/dL (ref 2.2–3.9)
IgA: 151 mg/dL (ref 64–422)
IgG (Immunoglobin G), Serum: 1426 mg/dL (ref 586–1602)
IgM (Immunoglobulin M), Srm: 78 mg/dL (ref 26–217)
M Protein SerPl Elph-Mcnc: 0.6 g/dL — ABNORMAL HIGH
Total Protein ELP: 7 g/dL (ref 6.0–8.5)

## 2023-08-09 ENCOUNTER — Encounter: Payer: Self-pay | Admitting: Hematology and Oncology

## 2023-08-09 ENCOUNTER — Inpatient Hospital Stay: Payer: Medicare Other | Admitting: Hematology and Oncology

## 2023-08-09 VITALS — BP 136/68 | HR 74 | Temp 98.0°F | Resp 18 | Ht 60.0 in | Wt 188.2 lb

## 2023-08-09 DIAGNOSIS — N183 Chronic kidney disease, stage 3 unspecified: Secondary | ICD-10-CM | POA: Diagnosis not present

## 2023-08-09 DIAGNOSIS — D472 Monoclonal gammopathy: Secondary | ICD-10-CM

## 2023-08-09 NOTE — Assessment & Plan Note (Addendum)
 I reviewed test results with patient which show stability of MGUS We discussed natural history of MGUS Her anemia and chronic kidney disease is not related to MGUS I will see her once a year

## 2023-08-09 NOTE — Progress Notes (Signed)
 [facility] OFFICE PROGRESS NOTE  Patient Care Team: Jimmey Mould, MD as PCP - General (Family Medicine)  ASSESSMENT & PLAN:  Assessment & Plan MGUS (monoclonal gammopathy of unknown significance) I reviewed test results with patient which show stability of MGUS We discussed natural history of MGUS Her anemia and chronic kidney disease is not related to MGUS I will see her once a year Stage 3 chronic kidney disease, unspecified whether stage 3a or 3b CKD (HCC) She has borderline chronic kidney disease stage IV We discussed importance of adequate hydration and risk factor modification  Orders Placed This Encounter  Procedures   CMP (Cancer Center only)    Standing Status:   Future    Expiration Date:   08/08/2024   CBC with Differential (Cancer Center Only)    Standing Status:   Future    Expiration Date:   08/08/2024   Multiple Myeloma Panel (SPEP&IFE w/QIG)    Standing Status:   Future    Expiration Date:   08/08/2024   Kappa/lambda light chains    Standing Status:   Future    Expiration Date:   08/08/2024     INTERVAL HISTORY: she returns for surveillance follow-up for diagnosis of MGUS Patient denies recurrent infection or bone pain We reviewed recent CBC, CMP and myeloma panel results  PHYSICAL EXAMINATION: ECOG PERFORMANCE STATUS: 0 - Asymptomatic  Vitals:   08/09/23 1202  BP: 136/68  Pulse: 74  Resp: 18  Temp: 98 F (36.7 C)  SpO2: 92%

## 2023-08-09 NOTE — Assessment & Plan Note (Addendum)
She has borderline chronic kidney disease stage IV We discussed importance of adequate hydration and risk factor modification

## 2023-09-17 DIAGNOSIS — Z8639 Personal history of other endocrine, nutritional and metabolic disease: Secondary | ICD-10-CM | POA: Diagnosis not present

## 2023-09-17 DIAGNOSIS — E1169 Type 2 diabetes mellitus with other specified complication: Secondary | ICD-10-CM | POA: Diagnosis not present

## 2023-09-17 DIAGNOSIS — E7800 Pure hypercholesterolemia, unspecified: Secondary | ICD-10-CM | POA: Diagnosis not present

## 2023-09-17 DIAGNOSIS — I10 Essential (primary) hypertension: Secondary | ICD-10-CM | POA: Diagnosis not present

## 2023-09-17 DIAGNOSIS — Z0000 Encounter for general adult medical examination without abnormal findings: Secondary | ICD-10-CM | POA: Diagnosis not present

## 2023-09-24 DIAGNOSIS — I10 Essential (primary) hypertension: Secondary | ICD-10-CM | POA: Diagnosis not present

## 2023-09-24 DIAGNOSIS — K5900 Constipation, unspecified: Secondary | ICD-10-CM | POA: Diagnosis not present

## 2023-09-24 DIAGNOSIS — Z0000 Encounter for general adult medical examination without abnormal findings: Secondary | ICD-10-CM | POA: Diagnosis not present

## 2023-09-24 DIAGNOSIS — E7800 Pure hypercholesterolemia, unspecified: Secondary | ICD-10-CM | POA: Diagnosis not present

## 2023-09-24 DIAGNOSIS — J309 Allergic rhinitis, unspecified: Secondary | ICD-10-CM | POA: Diagnosis not present

## 2023-09-24 DIAGNOSIS — E1122 Type 2 diabetes mellitus with diabetic chronic kidney disease: Secondary | ICD-10-CM | POA: Diagnosis not present

## 2023-09-24 DIAGNOSIS — M5430 Sciatica, unspecified side: Secondary | ICD-10-CM | POA: Diagnosis not present

## 2023-09-24 DIAGNOSIS — N184 Chronic kidney disease, stage 4 (severe): Secondary | ICD-10-CM | POA: Diagnosis not present

## 2023-09-24 DIAGNOSIS — M542 Cervicalgia: Secondary | ICD-10-CM | POA: Diagnosis not present

## 2023-09-25 DIAGNOSIS — D472 Monoclonal gammopathy: Secondary | ICD-10-CM | POA: Diagnosis not present

## 2023-09-25 DIAGNOSIS — D631 Anemia in chronic kidney disease: Secondary | ICD-10-CM | POA: Diagnosis not present

## 2023-09-25 DIAGNOSIS — I129 Hypertensive chronic kidney disease with stage 1 through stage 4 chronic kidney disease, or unspecified chronic kidney disease: Secondary | ICD-10-CM | POA: Diagnosis not present

## 2023-09-25 DIAGNOSIS — E785 Hyperlipidemia, unspecified: Secondary | ICD-10-CM | POA: Diagnosis not present

## 2023-09-25 DIAGNOSIS — E1169 Type 2 diabetes mellitus with other specified complication: Secondary | ICD-10-CM | POA: Diagnosis not present

## 2023-09-25 DIAGNOSIS — N1832 Chronic kidney disease, stage 3b: Secondary | ICD-10-CM | POA: Diagnosis not present

## 2023-09-25 DIAGNOSIS — E1122 Type 2 diabetes mellitus with diabetic chronic kidney disease: Secondary | ICD-10-CM | POA: Diagnosis not present

## 2023-09-26 DIAGNOSIS — N1832 Chronic kidney disease, stage 3b: Secondary | ICD-10-CM | POA: Diagnosis not present

## 2023-10-30 ENCOUNTER — Other Ambulatory Visit: Payer: Self-pay | Admitting: Family Medicine

## 2023-10-30 DIAGNOSIS — Z1231 Encounter for screening mammogram for malignant neoplasm of breast: Secondary | ICD-10-CM

## 2023-11-14 ENCOUNTER — Ambulatory Visit
Admission: RE | Admit: 2023-11-14 | Discharge: 2023-11-14 | Disposition: A | Discharge status: Home / Self Care | Source: Ambulatory Visit | Attending: Family Medicine | Admitting: Family Medicine

## 2023-11-14 DIAGNOSIS — Z1231 Encounter for screening mammogram for malignant neoplasm of breast: Secondary | ICD-10-CM | POA: Diagnosis not present

## 2023-11-21 ENCOUNTER — Other Ambulatory Visit: Payer: Self-pay | Admitting: Family Medicine

## 2023-11-21 DIAGNOSIS — R928 Other abnormal and inconclusive findings on diagnostic imaging of breast: Secondary | ICD-10-CM

## 2023-11-29 ENCOUNTER — Ambulatory Visit
Admission: RE | Admit: 2023-11-29 | Discharge: 2023-11-29 | Disposition: A | Discharge status: Home / Self Care | Source: Ambulatory Visit | Attending: Family Medicine | Admitting: Family Medicine

## 2023-11-29 DIAGNOSIS — R928 Other abnormal and inconclusive findings on diagnostic imaging of breast: Secondary | ICD-10-CM

## 2023-11-29 DIAGNOSIS — N6310 Unspecified lump in the right breast, unspecified quadrant: Secondary | ICD-10-CM | POA: Diagnosis not present

## 2023-11-29 DIAGNOSIS — R92321 Mammographic fibroglandular density, right breast: Secondary | ICD-10-CM | POA: Diagnosis not present

## 2023-11-30 ENCOUNTER — Other Ambulatory Visit: Payer: Self-pay | Admitting: Family Medicine

## 2023-11-30 DIAGNOSIS — R921 Mammographic calcification found on diagnostic imaging of breast: Secondary | ICD-10-CM

## 2023-11-30 DIAGNOSIS — N6310 Unspecified lump in the right breast, unspecified quadrant: Secondary | ICD-10-CM

## 2023-12-05 DIAGNOSIS — E119 Type 2 diabetes mellitus without complications: Secondary | ICD-10-CM | POA: Diagnosis not present

## 2023-12-10 ENCOUNTER — Ambulatory Visit
Admission: RE | Admit: 2023-12-10 | Discharge: 2023-12-10 | Disposition: A | Discharge status: Home / Self Care | Source: Ambulatory Visit | Attending: Family Medicine | Admitting: Family Medicine

## 2023-12-10 DIAGNOSIS — N6310 Unspecified lump in the right breast, unspecified quadrant: Secondary | ICD-10-CM

## 2023-12-10 DIAGNOSIS — N6021 Fibroadenosis of right breast: Secondary | ICD-10-CM | POA: Diagnosis not present

## 2023-12-10 DIAGNOSIS — N6315 Unspecified lump in the right breast, overlapping quadrants: Secondary | ICD-10-CM | POA: Diagnosis not present

## 2023-12-10 DIAGNOSIS — R921 Mammographic calcification found on diagnostic imaging of breast: Secondary | ICD-10-CM

## 2023-12-10 DIAGNOSIS — N6489 Other specified disorders of breast: Secondary | ICD-10-CM | POA: Diagnosis not present

## 2023-12-10 DIAGNOSIS — N6011 Diffuse cystic mastopathy of right breast: Secondary | ICD-10-CM | POA: Diagnosis not present

## 2023-12-10 HISTORY — PX: BREAST BIOPSY: SHX20

## 2023-12-11 LAB — SURGICAL PATHOLOGY

## 2024-06-04 ENCOUNTER — Ambulatory Visit: Admitting: Podiatry

## 2024-07-31 ENCOUNTER — Inpatient Hospital Stay

## 2024-08-08 ENCOUNTER — Inpatient Hospital Stay: Admitting: Hematology and Oncology
